# Patient Record
Sex: Male | Born: 1970 | Race: Black or African American | Hispanic: No | Marital: Single | Smoking: Current every day smoker
Health system: Southern US, Community
[De-identification: ages and names within clinical notes are randomized; demographics above are authoritative.]

## PROBLEM LIST (undated history)

## (undated) DIAGNOSIS — K5792 Diverticulitis of intestine, part unspecified, without perforation or abscess without bleeding: Secondary | ICD-10-CM

## (undated) HISTORY — PX: COLON RESECTION: SHX5231

## (undated) HISTORY — PX: HERNIA REPAIR: SHX51

## (undated) HISTORY — PX: TONSILLECTOMY: SUR1361

---

## 2017-03-19 ENCOUNTER — Encounter (HOSPITAL_BASED_OUTPATIENT_CLINIC_OR_DEPARTMENT_OTHER): Payer: Self-pay | Admitting: *Deleted

## 2017-03-19 ENCOUNTER — Emergency Department (HOSPITAL_BASED_OUTPATIENT_CLINIC_OR_DEPARTMENT_OTHER)
Admission: EM | Admit: 2017-03-19 | Discharge: 2017-03-19 | Disposition: A | Discharge status: Home / Self Care | Payer: Self-pay | Attending: Emergency Medicine | Admitting: Emergency Medicine

## 2017-03-19 ENCOUNTER — Emergency Department (HOSPITAL_BASED_OUTPATIENT_CLINIC_OR_DEPARTMENT_OTHER): Payer: Self-pay

## 2017-03-19 DIAGNOSIS — Y999 Unspecified external cause status: Secondary | ICD-10-CM | POA: Insufficient documentation

## 2017-03-19 DIAGNOSIS — W1842XA Slipping, tripping and stumbling without falling due to stepping into hole or opening, initial encounter: Secondary | ICD-10-CM | POA: Insufficient documentation

## 2017-03-19 DIAGNOSIS — Y929 Unspecified place or not applicable: Secondary | ICD-10-CM | POA: Insufficient documentation

## 2017-03-19 DIAGNOSIS — S8391XA Sprain of unspecified site of right knee, initial encounter: Secondary | ICD-10-CM | POA: Insufficient documentation

## 2017-03-19 DIAGNOSIS — Y9301 Activity, walking, marching and hiking: Secondary | ICD-10-CM | POA: Insufficient documentation

## 2017-03-19 DIAGNOSIS — Z87891 Personal history of nicotine dependence: Secondary | ICD-10-CM | POA: Insufficient documentation

## 2017-03-19 HISTORY — DX: Diverticulitis of intestine, part unspecified, without perforation or abscess without bleeding: K57.92

## 2017-03-19 NOTE — ED Provider Notes (Signed)
[facility] Provider [facility]   CSN: [id number] Arrival date & time: 03/19/17  1323     History   Chief Complaint Chief Complaint  Patient presents with  . Knee Injury    HPI Laurence FerrariSimeon Belflower is a 46 y.o. male.  The history is provided by the patient.  Knee Pain   This is a new problem. Episode onset: 3 days ago. The problem occurs constantly. The problem has not changed since onset.The pain is present in the right knee. The quality of the pain is described as aching. The pain is at a severity of 5/10. The pain is moderate. Pertinent negatives include full range of motion. Associated symptoms comments: When attempts to walk when he bends it a certain way develops a severe pain and knee feels like it will give out. The symptoms are aggravated by activity and standing. He has tried OTC pain medications for the symptoms. The treatment provided no relief. There has been a history of trauma (picking up sticks in his yard and stepped in a hole twisting his leg).    Past Medical History:  Diagnosis Date  . Diverticulitis     There are no active problems to display for this patient.   Past Surgical History:  Procedure Laterality Date  . COLON RESECTION    . HERNIA REPAIR    . TONSILLECTOMY         Home Medications    Prior to Admission medications   Not on File    Family History History reviewed. No pertinent family history.  Social History Social History  Substance Use Topics  . Smoking status: Current Some Day Smoker  . Smokeless tobacco: Not on file  . Alcohol use No     Allergies   Patient has no known allergies.   Review of Systems Review of Systems  All other systems reviewed and are negative.    Physical Exam Updated Vital Signs BP (!) 151/65   Pulse 89   Temp 98.2 F (36.8 C)   Resp 18   Ht 5\' 11"  (1.803 m)   Wt 81.6 kg (180 lb)   SpO2 100%   BMI 25.10 kg/m   Physical Exam  Constitutional: He is oriented to person, place, and  time. He appears well-developed and well-nourished.  HENT:  Head: Normocephalic.  Cardiovascular: Normal rate.   Pulmonary/Chest: Effort normal.  Musculoskeletal: He exhibits no deformity.       Right knee: He exhibits normal range of motion, no swelling, no effusion, no ecchymosis, no deformity, no LCL laxity, no bony tenderness and no MCL laxity. Tenderness found. Medial joint line and MCL tenderness noted. No lateral joint line tenderness noted.  Neurological: He is alert and oriented to person, place, and time.  Skin: Skin is warm. Capillary refill takes less than 2 seconds.  Psychiatric: He has a normal mood and affect. His behavior is normal.  Nursing note and vitals reviewed.    ED Treatments / Results  Labs (all labs ordered are listed, but only abnormal results are displayed) Labs Reviewed - No data to display  EKG  EKG Interpretation None       Radiology Dg Knee Complete 4 Views Right  Result Date: 03/19/2017 CLINICAL DATA:  Twisting injury 3 days ago. Posteromedial knee pain and swelling. EXAM: RIGHT KNEE - COMPLETE 4+ VIEW COMPARISON:  None. FINDINGS: The mineralization and alignment are normal. There is no evidence of acute fracture or dislocation. The joint spaces are maintained. No significant joint  effusion or focal soft tissue swelling. IMPRESSION: No acute osseous findings. Electronically Signed   By: Carey Bullocks M.D.   On: 03/19/2017 13:57    Procedures Procedures (including critical care time)  Medications Ordered in ED Medications - No data to display   Initial Impression / Assessment and Plan / ED Course  I have reviewed the triage vital signs and the nursing notes.  Pertinent labs & imaging results that were available during my care of the patient were reviewed by me and considered in my medical decision making (see chart for details).     Patient with symptoms of knee sprain today. On exam tenderness around the medial and posterior knee  without significant swelling. No ligamental laxity appreciated at this time however concern for potential ligamentous injury versus severe sprain. Patient placed in a knee sleeve. X-rays without acute findings. Patient was instructed to elevate, ice and use NSAIDs.  Final Clinical Impressions(s) / ED Diagnoses   Final diagnoses:  Sprain of right knee, unspecified ligament, initial encounter    New Prescriptions New Prescriptions   No medications on file     Gwyneth Sprout, MD 03/19/17 1504

## 2017-03-19 NOTE — ED Triage Notes (Signed)
Pt c/o right knee injury x 3 days ago

## 2018-05-08 ENCOUNTER — Emergency Department (HOSPITAL_BASED_OUTPATIENT_CLINIC_OR_DEPARTMENT_OTHER)
Admission: EM | Admit: 2018-05-08 | Discharge: 2018-05-08 | Disposition: A | Discharge status: Home / Self Care | Payer: No Typology Code available for payment source | Attending: Emergency Medicine | Admitting: Emergency Medicine

## 2018-05-08 ENCOUNTER — Encounter (HOSPITAL_BASED_OUTPATIENT_CLINIC_OR_DEPARTMENT_OTHER): Payer: Self-pay | Admitting: Emergency Medicine

## 2018-05-08 ENCOUNTER — Other Ambulatory Visit: Payer: Self-pay

## 2018-05-08 ENCOUNTER — Emergency Department (HOSPITAL_BASED_OUTPATIENT_CLINIC_OR_DEPARTMENT_OTHER): Payer: No Typology Code available for payment source

## 2018-05-08 DIAGNOSIS — Y9389 Activity, other specified: Secondary | ICD-10-CM | POA: Diagnosis not present

## 2018-05-08 DIAGNOSIS — Y92410 Unspecified street and highway as the place of occurrence of the external cause: Secondary | ICD-10-CM | POA: Insufficient documentation

## 2018-05-08 DIAGNOSIS — F17210 Nicotine dependence, cigarettes, uncomplicated: Secondary | ICD-10-CM | POA: Insufficient documentation

## 2018-05-08 DIAGNOSIS — S161XXA Strain of muscle, fascia and tendon at neck level, initial encounter: Secondary | ICD-10-CM | POA: Diagnosis not present

## 2018-05-08 DIAGNOSIS — S199XXA Unspecified injury of neck, initial encounter: Secondary | ICD-10-CM | POA: Diagnosis present

## 2018-05-08 DIAGNOSIS — Y999 Unspecified external cause status: Secondary | ICD-10-CM | POA: Diagnosis not present

## 2018-05-08 DIAGNOSIS — S39012A Strain of muscle, fascia and tendon of lower back, initial encounter: Secondary | ICD-10-CM

## 2018-05-08 MED ORDER — METHOCARBAMOL 500 MG PO TABS: 500.0000 mg | ORAL_TABLET | Freq: Three times a day (TID) | ORAL | 0 refills | Status: DC | PRN

## 2018-05-08 MED FILL — METHOCARBAMOL 500 MG TABLET: 500 | 2 days supply | Qty: 8 | Fill #0
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
## 2019-12-22 ENCOUNTER — Emergency Department (HOSPITAL_BASED_OUTPATIENT_CLINIC_OR_DEPARTMENT_OTHER)
Admission: EM | Admit: 2019-12-22 | Discharge: 2019-12-22 | Disposition: A | Discharge status: Home / Self Care | Payer: Self-pay | Attending: Emergency Medicine | Admitting: Emergency Medicine

## 2019-12-22 ENCOUNTER — Encounter (HOSPITAL_BASED_OUTPATIENT_CLINIC_OR_DEPARTMENT_OTHER): Payer: Self-pay | Admitting: Emergency Medicine

## 2019-12-22 ENCOUNTER — Other Ambulatory Visit: Payer: Self-pay

## 2019-12-22 DIAGNOSIS — F17210 Nicotine dependence, cigarettes, uncomplicated: Secondary | ICD-10-CM | POA: Insufficient documentation

## 2019-12-22 DIAGNOSIS — I10 Essential (primary) hypertension: Secondary | ICD-10-CM | POA: Insufficient documentation

## 2019-12-22 DIAGNOSIS — Z7901 Long term (current) use of anticoagulants: Secondary | ICD-10-CM | POA: Insufficient documentation

## 2019-12-22 LAB — BASIC METABOLIC PANEL
Anion gap: 8 (ref 5–15)
BUN: 13 mg/dL (ref 6–20)
CO2: 28 mmol/L (ref 22–32)
Calcium: 9.7 mg/dL (ref 8.9–10.3)
Chloride: 102 mmol/L (ref 98–111)
Creatinine, Ser: 0.98 mg/dL (ref 0.61–1.24)
GFR calc Af Amer: >60 mL/min (ref >60)
GFR calc non Af Amer: >60 mL/min (ref >60)
Glucose, Bld: 114 mg/dL — ABNORMAL HIGH (ref 70–99)
Potassium: 3.7 mmol/L (ref 3.5–5.1)
Sodium: 138 mmol/L (ref 135–145)

## 2019-12-22 MED ORDER — LACTATED RINGERS IV BOLUS
1000.0000 mL | Freq: Once | INTRAVENOUS | Status: AC
  Administered 2019-12-22: 1000 mL via INTRAVENOUS

## 2019-12-22 MED ORDER — METOCLOPRAMIDE HCL 5 MG/ML IJ SOLN
10.0000 mg | Freq: Once | INTRAMUSCULAR | Status: AC
  Administered 2019-12-22: 10 mg via INTRAVENOUS
  Filled 2019-12-22: qty 2

## 2019-12-22 MED ORDER — DIPHENHYDRAMINE HCL 50 MG/ML IJ SOLN
12.5000 mg | Freq: Once | INTRAMUSCULAR | Status: AC
  Administered 2019-12-22: 12.5 mg via INTRAVENOUS
  Filled 2019-12-22: qty 1

## 2019-12-22 MED ORDER — AMLODIPINE BESYLATE 5 MG PO TABS: 5.0000 mg | ORAL_TABLET | Freq: Every day | ORAL | 2 refills | Status: DC

## 2019-12-22 MED ORDER — KETOROLAC TROMETHAMINE 15 MG/ML IJ SOLN
15.0000 mg | Freq: Once | INTRAMUSCULAR | Status: AC
  Administered 2019-12-22: 15 mg via INTRAVENOUS
  Filled 2019-12-22: qty 1

## 2019-12-22 MED FILL — AMLODIPINE BESYLATE 5 MG TA: 5 | 30 days supply | Qty: 30 | Fill #0

## 2019-12-22 NOTE — ED Triage Notes (Signed)
Pt at work this morning and his boss was taking her own bp and when pt stated he has been having a headache she took his.  186/114 and then 184/106 at work. Has never been treated for bp but has had random episodes of high bp.  Sts he does not have a pmd.

## 2019-12-26 NOTE — ED Provider Notes (Signed)
[facility] EMERGENCY DEPARTMENT Provider Note   CSN: [id number] Arrival date & time: 12/22/19  0845     History Chief Complaint  Patient presents with  . Hypertension    Clarence Wilson is a 49 y.o. male.  HPI   49 year old male with hypertension.  Noted to be very hypertensive while at work today.  He states that he has had high blood pressure when checked previously but he is not on medications.  He does have a headache currently.  He has had similar headaches previously.  Is diffuse and achy.  No photo or phonophobia.  No changes in visual acuity.  No acute numbness, tingling or focal loss of strength.  No fevers or neck pain/stiffness.   Past Medical History:  Diagnosis Date  . Diverticulitis     There are no problems to display for this patient.   Past Surgical History:  Procedure Laterality Date  . COLON RESECTION    . HERNIA REPAIR    . TONSILLECTOMY         No family history on file.  Social History   Tobacco Use  . Smoking status: Current Every Day Smoker    Packs/day: 0.50    Types: Cigarettes  . Smokeless tobacco: Never Used  Substance Use Topics  . Alcohol use: No  . Drug use: No    Home Medications Prior to Admission medications   Medication Sig Start Date End Date Taking? Authorizing Provider  amLODipine (NORVASC) 5 MG tablet Take 1 tablet (5 mg total) by mouth daily. 12/22/19   Raeford Razor, MD    Allergies    Patient has no known allergies.  Review of Systems   Review of Systems All systems reviewed and negative, other than as noted in HPI.  Physical Exam Updated Vital Signs BP (!) 151/91 (BP Location: Left Arm)   Pulse 79   Temp 98.3 F (36.8 C) (Oral)   Resp 16   Ht 5\' 11"  (1.803 m)   Wt 81.6 kg   SpO2 100%   BMI 25.10 kg/m   Physical Exam Vitals and nursing note reviewed.  Constitutional:      General: He is not in acute distress.    Appearance: He is well-developed.  HENT:     Head: Normocephalic and  atraumatic.  Eyes:     General:        Right eye: No discharge.        Left eye: No discharge.     Conjunctiva/sclera: Conjunctivae normal.  Cardiovascular:     Rate and Rhythm: Normal rate and regular rhythm.     Heart sounds: Normal heart sounds. No murmur heard.  No friction rub. No gallop.   Pulmonary:     Effort: Pulmonary effort is normal. No respiratory distress.     Breath sounds: Normal breath sounds.  Abdominal:     General: There is no distension.     Palpations: Abdomen is soft.     Tenderness: There is no abdominal tenderness.  Musculoskeletal:        General: No tenderness.     Cervical back: Normal range of motion and neck supple. No rigidity.  Skin:    General: Skin is warm and dry.  Neurological:     Mental Status: He is alert and oriented to person, place, and time.     Cranial Nerves: No cranial nerve deficit.     Sensory: No sensory deficit.     Motor: No weakness.  Coordination: Coordination normal.  Psychiatric:        Behavior: Behavior normal.        Thought Content: Thought content normal.     ED Results / Procedures / Treatments   Labs (all labs ordered are listed, but only abnormal results are displayed) Labs Reviewed  BASIC METABOLIC PANEL - Abnormal; Notable for the following components:      Result Value   Glucose, Bld 114 (*)    All other components within normal limits    EKG None  Radiology No results found.  Procedures Procedures (including critical care time)  Medications Ordered in ED Medications  ketorolac (TORADOL) 15 MG/ML injection 15 mg (15 mg Intravenous Given 12/22/19 1025)  lactated ringers bolus 1,000 mL ( Intravenous Stopped 12/22/19 1115)  metoCLOPramide (REGLAN) injection 10 mg (10 mg Intravenous Given 12/22/19 1027)  diphenhydrAMINE (BENADRYL) injection 12.5 mg (12.5 mg Intravenous Given 12/22/19 1023)    ED Course  I have reviewed the triage vital signs and the nursing notes.  Pertinent labs & imaging  results that were available during my care of the patient were reviewed by me and considered in my medical decision making (see chart for details).    MDM Rules/Calculators/A&P                          49 year old male with hypertension.  History of the same.  Not on medications.  Medically screened.  He does have a headache but he has had similar headaches previously.  Neuro exam is nonfocal.  I am not convinced that this is directly related.  Regardless, he is treated symptomatic with significant improvement.  I doubt emergent process.  We will place him on amlodipine.  Needs to keep a log of his blood pressure and follow-up as an outpatient.  Return precautions discussed. Final Clinical Impression(s) / ED Diagnoses Final diagnoses:  Hypertension, unspecified type    Rx / DC Orders ED Discharge Orders         Ordered    amLODipine (NORVASC) 5 MG tablet  Daily     Discontinue  Reprint     12/22/19 1107           Virgel Manifold, MD 12/26/19 1244

## 2020-08-16 ENCOUNTER — Encounter (HOSPITAL_BASED_OUTPATIENT_CLINIC_OR_DEPARTMENT_OTHER): Payer: Self-pay | Admitting: *Deleted

## 2020-08-16 ENCOUNTER — Other Ambulatory Visit (HOSPITAL_BASED_OUTPATIENT_CLINIC_OR_DEPARTMENT_OTHER): Payer: Self-pay | Admitting: Emergency Medicine

## 2020-08-16 ENCOUNTER — Emergency Department (HOSPITAL_BASED_OUTPATIENT_CLINIC_OR_DEPARTMENT_OTHER)
Admission: EM | Admit: 2020-08-16 | Discharge: 2020-08-16 | Disposition: A | Discharge status: Home / Self Care | Payer: Self-pay | Attending: Emergency Medicine | Admitting: Emergency Medicine

## 2020-08-16 ENCOUNTER — Emergency Department (HOSPITAL_BASED_OUTPATIENT_CLINIC_OR_DEPARTMENT_OTHER): Payer: Self-pay

## 2020-08-16 ENCOUNTER — Other Ambulatory Visit: Payer: Self-pay

## 2020-08-16 DIAGNOSIS — S2232XA Fracture of one rib, left side, initial encounter for closed fracture: Secondary | ICD-10-CM | POA: Insufficient documentation

## 2020-08-16 DIAGNOSIS — S2231XA Fracture of one rib, right side, initial encounter for closed fracture: Secondary | ICD-10-CM

## 2020-08-16 DIAGNOSIS — W108XXA Fall (on) (from) other stairs and steps, initial encounter: Secondary | ICD-10-CM | POA: Insufficient documentation

## 2020-08-16 DIAGNOSIS — F17210 Nicotine dependence, cigarettes, uncomplicated: Secondary | ICD-10-CM | POA: Insufficient documentation

## 2020-08-16 DIAGNOSIS — Y9389 Activity, other specified: Secondary | ICD-10-CM | POA: Insufficient documentation

## 2020-08-16 DIAGNOSIS — R059 Cough, unspecified: Secondary | ICD-10-CM | POA: Insufficient documentation

## 2020-08-16 DIAGNOSIS — M546 Pain in thoracic spine: Secondary | ICD-10-CM | POA: Insufficient documentation

## 2020-08-16 LAB — URINALYSIS, MICROSCOPIC (REFLEX)
Bacteria, UA: NONE SEEN
Squamous Epithelial / LPF: NONE SEEN (ref 0–5)

## 2020-08-16 LAB — URINALYSIS, ROUTINE W REFLEX MICROSCOPIC
Bilirubin Urine: NEGATIVE
Glucose, UA: NEGATIVE mg/dL
Ketones, ur: NEGATIVE mg/dL
Leukocytes,Ua: NEGATIVE
Nitrite: NEGATIVE
Specific Gravity, Urine: 1.015 (ref 1.005–1.030)
pH: 7 (ref 5.0–8.0)

## 2020-08-16 MED ORDER — NAPROXEN 500 MG PO TABS: 500.0000 mg | ORAL_TABLET | Freq: Two times a day (BID) | ORAL | 0 refills | Status: DC

## 2020-08-16 MED ORDER — KETOROLAC TROMETHAMINE 30 MG/ML IJ SOLN
30.0000 mg | Freq: Once | INTRAMUSCULAR | Status: AC
  Administered 2020-08-16: 30 mg via INTRAMUSCULAR
  Filled 2020-08-16: qty 1

## 2020-08-16 MED ORDER — OXYCODONE-ACETAMINOPHEN 5-325 MG PO TABS: 1.0000 | ORAL_TABLET | Freq: Four times a day (QID) | ORAL | 0 refills | Status: DC | PRN

## 2020-08-16 MED ORDER — HYDROCODONE-ACETAMINOPHEN 5-325 MG PO TABS
1.0000 | ORAL_TABLET | Freq: Once | ORAL | Status: AC
  Administered 2020-08-16: 1 via ORAL
  Filled 2020-08-16: qty 1

## 2020-08-16 MED FILL — OXYCODONE-ACETAMINOPHEN 5-3: 5-325 | 4 days supply | Qty: 15 | Fill #0

## 2020-08-16 MED FILL — NAPROXEN 500 MG TABS: 500 | 15 days supply | Qty: 30 | Fill #0

## 2020-08-16 NOTE — ED Provider Notes (Signed)
[facility] EMERGENCY DEPARTMENT Provider Note   CSN: [id number] Arrival date & time: 08/16/20  0545     History Chief Complaint  Patient presents with  . Back Pain    Clarence Wilson is a 50 y.o. male.  HPI     This is a 50 year old male who presents with right posterior chest pain.  Patient reports that he slipped and fell taking out the trash 2 days ago.  He fell onto his back.  Since that time he has had progressively worsening right posterior chest pain.  He states that yesterday it was so excruciating he had difficulty getting up and about.  He did not have any medication to take for the pain.  Pain is worse with deep breathing and certain movements.  He denies any weakness, numbness, tingling of the lower extremities.  He does report that he has only urinated once because he has had difficulty getting up and going to the bathroom.  Denies hitting his head or loss of consciousness.  Rates his pain at "12 out of 10."  Past Medical History:  Diagnosis Date  . Diverticulitis     There are no problems to display for this patient.   Past Surgical History:  Procedure Laterality Date  . COLON RESECTION    . HERNIA REPAIR    . TONSILLECTOMY         No family history on file.  Social History   Tobacco Use  . Smoking status: Current Every Day Smoker    Packs/day: 0.50    Types: Cigarettes  . Smokeless tobacco: Never Used  Substance Use Topics  . Alcohol use: Yes    Comment: occasional   . Drug use: No    Home Medications Prior to Admission medications   Not on File    Allergies    Patient has no known allergies.  Review of Systems   Review of Systems  Constitutional: Negative for fever.  Respiratory: Negative for shortness of breath.   Cardiovascular: Positive for chest pain.  Gastrointestinal: Negative for abdominal pain, nausea and vomiting.  Genitourinary: Negative for dysuria.  Skin: Negative for wound.  All other systems reviewed and are  negative.   Physical Exam Updated Vital Signs BP (!) 175/93 (BP Location: Right Arm)   Pulse 84   Temp 98.1 F (36.7 C) (Oral)   Resp 18   Ht 1.803 m (5\' 11" )   Wt 77.1 kg   SpO2 100%   BMI 23.71 kg/m   Physical Exam Vitals and nursing note reviewed.  Constitutional:      Appearance: He is well-developed and well-nourished. He is not ill-appearing.     Comments: ABCs intact  HENT:     Head: Normocephalic and atraumatic.     Nose: Nose normal.     Mouth/Throat:     Mouth: Mucous membranes are moist.  Eyes:     Pupils: Pupils are equal, round, and reactive to light.  Cardiovascular:     Rate and Rhythm: Normal rate and regular rhythm.     Heart sounds: Normal heart sounds. No murmur heard.     Comments: Tenderness palpation right sided posterior chest wall, no crepitus or overlying skin changes Pulmonary:     Effort: Pulmonary effort is normal. No respiratory distress.     Breath sounds: Normal breath sounds. No wheezing.  Abdominal:     General: Bowel sounds are normal.     Palpations: Abdomen is soft.     Tenderness: There  is no abdominal tenderness. There is no rebound.  Musculoskeletal:        General: No edema.     Cervical back: Neck supple.     Right lower leg: No edema.     Left lower leg: No edema.     Comments: No midline thoracic or lumbar tenderness palpation, step-off, or deformity  Lymphadenopathy:     Cervical: No cervical adenopathy.  Skin:    General: Skin is warm and dry.  Neurological:     Mental Status: He is alert and oriented to person, place, and time.     Comments: Ambulatory independently, 5 out of 5 strength bilateral lower extremities  Psychiatric:        Mood and Affect: Mood and affect and mood normal.     ED Results / Procedures / Treatments   Labs (all labs ordered are listed, but only abnormal results are displayed) Labs Reviewed  URINALYSIS, ROUTINE W REFLEX MICROSCOPIC    EKG None  Radiology No results  found.  Procedures Procedures   Medications Ordered in ED Medications  HYDROcodone-acetaminophen (NORCO/VICODIN) 5-325 MG per tablet 1 tablet (has no administration in time range)  ketorolac (TORADOL) 30 MG/ML injection 30 mg (30 mg Intramuscular Given 08/16/20 3570)    ED Course  I have reviewed the triage vital signs and the nursing notes.  Pertinent labs & imaging results that were available during my care of the patient were reviewed by me and considered in my medical decision making (see chart for details).    MDM Rules/Calculators/A&P                          Patient presents with right posterior chest wall pain following.  He is nontoxic and vital signs are notable for blood pressure 175/93.  This could be pain related.  He has point tenderness to palpation over the right posterior chest wall without overlying skin changes.  Suspect fracture versus contusion.  He has no midline tenderness to suggest spinal injury.  He does report difficulty urinating.  Will obtain urinalysis and postvoid residual to make sure that he is not having urinary retention.    Chest x-ray independently reviewed by myself.  He appears to have 1 slightly displaced posterior rib fracture on the right.  Incentive spirometry ordered.  Patient was given Toradol and Norco.  Pulmonary toilet and pain control.  Final Clinical Impression(s) / ED Diagnoses Final diagnoses:  Closed fracture of one rib of right side, initial encounter    Rx / DC Orders ED Discharge Orders    None       Horton, Mayer Masker, MD 08/16/20 [phone]

## 2020-08-16 NOTE — ED Triage Notes (Addendum)
Pt states he was taking the trash out Saturday when he missed a step causing him to fall down 4 stairs. States stairs were carpet. States on Sunday he was having so much pain he was not able to move around much. States pain with inspiration and movement. C/o right sided mid back pain. Denies any blood in urine. Does states that he has only voided once since he fell. States he feels the urge to urinate but he is not able due to the pain.  States he has had a cough since the fall. Denies any tingling or numbness in his legs.

## 2020-08-16 NOTE — ED Notes (Signed)
Pt reported unable to pee at this time; this RN and EMT bladder scanned, got 15mL; EDP informed; verbal orders for In and Out Cath

## 2020-08-16 NOTE — ED Notes (Signed)
Pt able to urinate, In and Out Cath neccessary

## 2020-08-16 NOTE — Discharge Instructions (Addendum)
You were seen today and found to have a rib fracture.  Make sure to use your incentive spirometer.  Use pain medications as prescribed do not drive while using pain medications.

## 2020-08-16 NOTE — ED Notes (Signed)
Pt to Xray.

## 2020-08-16 NOTE — ED Notes (Signed)
EDP at bedside  

## 2020-09-02 ENCOUNTER — Encounter (HOSPITAL_BASED_OUTPATIENT_CLINIC_OR_DEPARTMENT_OTHER): Payer: Self-pay | Admitting: Emergency Medicine

## 2020-09-02 ENCOUNTER — Emergency Department (HOSPITAL_BASED_OUTPATIENT_CLINIC_OR_DEPARTMENT_OTHER): Payer: Self-pay

## 2020-09-02 ENCOUNTER — Emergency Department (HOSPITAL_BASED_OUTPATIENT_CLINIC_OR_DEPARTMENT_OTHER)
Admission: EM | Admit: 2020-09-02 | Discharge: 2020-09-02 | Disposition: A | Discharge status: Home / Self Care | Payer: Self-pay | Attending: Emergency Medicine | Admitting: Emergency Medicine

## 2020-09-02 ENCOUNTER — Other Ambulatory Visit: Payer: Self-pay

## 2020-09-02 DIAGNOSIS — M549 Dorsalgia, unspecified: Secondary | ICD-10-CM | POA: Insufficient documentation

## 2020-09-02 DIAGNOSIS — F17210 Nicotine dependence, cigarettes, uncomplicated: Secondary | ICD-10-CM | POA: Insufficient documentation

## 2020-09-02 DIAGNOSIS — R0781 Pleurodynia: Secondary | ICD-10-CM | POA: Insufficient documentation

## 2020-09-02 MED ORDER — ACETAMINOPHEN 500 MG PO TABS
1000.0000 mg | ORAL_TABLET | Freq: Once | ORAL | Status: AC
  Administered 2020-09-02: 1000 mg via ORAL
  Filled 2020-09-02: qty 2

## 2020-09-02 MED ORDER — IBUPROFEN 200 MG PO TABS
600.0000 mg | ORAL_TABLET | Freq: Once | ORAL | Status: AC
  Administered 2020-09-02: 600 mg via ORAL
  Filled 2020-09-02: qty 1

## 2020-09-02 NOTE — Discharge Instructions (Addendum)
Continue taking motrin 800 mg every 8 hours, and tylenol 650 mg every 6 hours for the next week for pain.  You can take norco as needed.

## 2020-09-02 NOTE — ED Notes (Signed)
Dr Trifan at bedside  

## 2020-09-02 NOTE — ED Notes (Signed)
Pt returns to ED with worsening rib pain today. States he has had ongoing sob since the injury on 2/7 as well. He reports that he did some moving around today and it feels different. Pt alert & oriented; slow and even respirations; nad noted.

## 2020-09-02 NOTE — ED Provider Notes (Signed)
[facility] EMERGENCY DEPARTMENT Provider Note   CSN: [id number] Arrival date & time: 09/02/20  1154     History Chief Complaint  Patient presents with  . thoracic pain    right    Clarence Wilson is a 50 y.o. male with a history of right 10th rib fracture diagnosed 2 weeks ago in the emergency department presenting to emergency department today with worsening chest pain.  He reports his pain has been improving since the last visit until this morning.  He woke up from bed with significant worsening of his right-sided chest pain.  It is a sharp pain that wraps around to his back.  It is worse with inspiration and any movement.  He reports he has been chronically coughing for 2 weeks.  He has been using incentive spirometer as directed whenever he returns home, but does not use it at work in the daytime.  He denies fevers or chills.  He denies any new falls or injuries.  He has not taken any pain medications today because of work.  Ed visit on 08/16/20:  FINDINGS: Normal lung volumes and mediastinal contours. Visualized tracheal air column is within normal limits. Lung markings appear within normal limits, both lungs appear clear with no pneumothorax or pleural effusion.  Negative visible bowel gas pattern.  Bone mineralization is within normal limits. Oblique views of the right ribs. Minimally displaced lateral right 10th rib fracture. No other right rib fracture identified. Other visible osseous structures appear intact.  IMPRESSION: 1. Minimally displaced right 10th rib fracture. 2. No acute cardiopulmonary abnormality.   HPI     Past Medical History:  Diagnosis Date  . Diverticulitis     There are no problems to display for this patient.   Past Surgical History:  Procedure Laterality Date  . COLON RESECTION    . HERNIA REPAIR    . TONSILLECTOMY         No family history on file.  Social History   Tobacco Use  . Smoking status: Current Every  Day Smoker    Packs/day: 0.50    Types: Cigarettes  . Smokeless tobacco: Never Used  Substance Use Topics  . Alcohol use: Yes    Comment: occasional   . Drug use: No    Home Medications Prior to Admission medications   Medication Sig Start Date End Date Taking? Authorizing Provider  naproxen (NAPROSYN) 500 MG tablet Take 1 tablet (500 mg total) by mouth 2 (two) times daily. 08/16/20   Horton, Mayer Masker, MD  oxyCODONE-acetaminophen (PERCOCET/ROXICET) 5-325 MG tablet Take 1 tablet by mouth every 6 (six) hours as needed for severe pain. 08/16/20   Horton, Mayer Masker, MD    Allergies    Patient has no known allergies.  Review of Systems   Review of Systems  Constitutional: Negative for chills and fever.  Respiratory: Negative for cough and shortness of breath.   Cardiovascular: Positive for chest pain. Negative for palpitations.  Gastrointestinal: Negative for abdominal pain and vomiting.  Musculoskeletal: Positive for arthralgias and myalgias.  Skin: Negative for color change and rash.  Neurological: Negative for syncope and light-headedness.  All other systems reviewed and are negative.   Physical Exam Updated Vital Signs BP (!) 158/102   Pulse 80   Temp 97.8 F (36.6 C) (Oral)   Resp 17   SpO2 100%   Physical Exam Constitutional:      General: He is not in acute distress. HENT:     Head: Normocephalic  and atraumatic.  Eyes:     Conjunctiva/sclera: Conjunctivae normal.     Pupils: Pupils are equal, round, and reactive to light.  Cardiovascular:     Rate and Rhythm: Normal rate and regular rhythm.     Pulses: Normal pulses.  Pulmonary:     Effort: Pulmonary effort is normal. No respiratory distress.     Comments: 97% on room air Abdominal:     General: There is no distension.     Tenderness: There is no abdominal tenderness.  Musculoskeletal:     Comments: Right lower/lateral rib tenderness, ribs 9-11th  Skin:    General: Skin is warm and dry.  Neurological:      General: No focal deficit present.     Mental Status: He is alert. Mental status is at baseline.  Psychiatric:        Mood and Affect: Mood normal.        Behavior: Behavior normal.     ED Results / Procedures / Treatments   Labs (all labs ordered are listed, but only abnormal results are displayed) Labs Reviewed - No data to display  EKG None  Radiology CT Chest Wo Contrast  Result Date: 09/02/2020 CLINICAL DATA:  Cough, rib pain.  Recent rib fracture EXAM: CT CHEST WITHOUT CONTRAST TECHNIQUE: Multidetector CT imaging of the chest was performed following the standard protocol without IV contrast. COMPARISON:  08/16/2020 FINDINGS: Cardiovascular: Heart size is normal. No pericardial effusion. Thoracic aorta is normal in course and caliber. Minimal atherosclerotic calcification of the thoracic aorta. Main pulmonary trunk is normal in caliber. Mediastinum/Nodes: No enlarged mediastinal or axillary lymph nodes. Thyroid gland, trachea, and esophagus demonstrate no significant findings. Lungs/Pleura: No pulmonary contusion or laceration. Focal area of pleuroparenchymal scarring at the peripheral aspect of the left lower lobe. No focal airspace consolidation. No pleural effusion or pneumothorax. Upper Abdomen: No acute findings.  Scattered colonic diverticulosis. Musculoskeletal: Subacute minimally displaced fractures of the posterolateral aspects of the right ninth and tenth ribs (series 5, images 97 and 100) with early callus formation. Remaining visualized osseous structures are intact. Thoracic vertebral body heights and alignment are maintained. IMPRESSION: 1. Healing subacute minimally displaced fractures of the posterolateral aspects of the right ninth and tenth ribs. 2. Lungs are clear.  No pneumothorax. 3. Colonic diverticulosis. 4. Aortic atherosclerosis (ICD10-I70.0). Electronically Signed   By: Duanne Guess D.O.   On: 09/02/2020 14:19    Procedures Procedures   Medications  Ordered in ED Medications  acetaminophen (TYLENOL) tablet 1,000 mg (1,000 mg Oral Given 09/02/20 1237)  ibuprofen (ADVIL) tablet 600 mg (600 mg Oral Given 09/02/20 1237)    ED Course  I have reviewed the triage vital signs and the nursing notes.  Pertinent labs & imaging results that were available during my care of the patient were reviewed by me and considered in my medical decision making (see chart for details).  50 year old male here with worsening right lower lung and rib pain beginning this morning.  ddx includes new rib fx vs interval development of PNA vs PTX vs other  No hypoxia, appears comfortable on exam PO motrin and tylenol ordered for pain CT chest noncontrast ordered  Doubt PE, ACS Prior Ed visit records reviewed - xray on 08/16/20  Clinical Course as of 09/02/20 1542  Thu Sep 02, 2020  1326 PAC/[facility] imaging system is down - unable to send images for radiology review at the moment.  I reviewed the images myself and see no evidence of PNA.  He does not want to wait longer as he has to return to work.  He reports his pain did improve with the PO meds.  I think it's reasonable to discharge him at this point - I will contact him if there is concern for new acute findings on CT scan. [MT]  1541 CT report - no acute pulmonary findings.  2 rib fx noted, both with callus formation, likely from original injury.  I called back patient to inform him of these findings - advised same plan of care.  He verbalized understanding. [MT]    Clinical Course User Index [MT] Dionicio Shelnutt, Kermit Balo, MD    Final Clinical Impression(s) / ED Diagnoses Final diagnoses:  Rib pain    Rx / DC Orders ED Discharge Orders    None       Terald Sleeper, MD 09/02/20 [phone]

## 2020-09-02 NOTE — ED Triage Notes (Signed)
Recurrent right ribs cage pain , post fall 2 weeks ago , fractured ribs per pt. Worsening in Shortness of breath .

## 2021-10-24 ENCOUNTER — Emergency Department (HOSPITAL_BASED_OUTPATIENT_CLINIC_OR_DEPARTMENT_OTHER)
Admission: EM | Admit: 2021-10-24 | Discharge: 2021-10-24 | Disposition: A | Discharge status: Home / Self Care | Payer: Self-pay | Attending: Emergency Medicine | Admitting: Emergency Medicine

## 2021-10-24 ENCOUNTER — Emergency Department (HOSPITAL_BASED_OUTPATIENT_CLINIC_OR_DEPARTMENT_OTHER): Payer: Self-pay

## 2021-10-24 ENCOUNTER — Other Ambulatory Visit: Payer: Self-pay

## 2021-10-24 ENCOUNTER — Encounter (HOSPITAL_BASED_OUTPATIENT_CLINIC_OR_DEPARTMENT_OTHER): Payer: Self-pay

## 2021-10-24 ENCOUNTER — Other Ambulatory Visit (HOSPITAL_BASED_OUTPATIENT_CLINIC_OR_DEPARTMENT_OTHER): Payer: Self-pay

## 2021-10-24 DIAGNOSIS — E876 Hypokalemia: Secondary | ICD-10-CM

## 2021-10-24 DIAGNOSIS — R1032 Left lower quadrant pain: Secondary | ICD-10-CM | POA: Insufficient documentation

## 2021-10-24 DIAGNOSIS — R197 Diarrhea, unspecified: Secondary | ICD-10-CM

## 2021-10-24 LAB — COMPREHENSIVE METABOLIC PANEL
ALT: 29 U/L (ref 0–44)
AST: 36 U/L (ref 15–41)
Albumin: 4.5 g/dL (ref 3.5–5.0)
Alkaline Phosphatase: 38 U/L (ref 38–126)
Anion gap: 13 (ref 5–15)
BUN: 9 mg/dL (ref 6–20)
CO2: 26 mmol/L (ref 22–32)
Calcium: 9.4 mg/dL (ref 8.9–10.3)
Chloride: 98 mmol/L (ref 98–111)
Creatinine, Ser: 0.85 mg/dL (ref 0.61–1.24)
GFR, Estimated: >60 mL/min (ref >60)
Glucose, Bld: 106 mg/dL — ABNORMAL HIGH (ref 70–99)
Potassium: 3.2 mmol/L — ABNORMAL LOW (ref 3.5–5.1)
Sodium: 137 mmol/L (ref 135–145)
Total Bilirubin: 1.3 mg/dL — ABNORMAL HIGH (ref 0.3–1.2)
Total Protein: 7.7 g/dL (ref 6.5–8.1)

## 2021-10-24 LAB — CBC WITH DIFFERENTIAL/PLATELET
Abs Immature Granulocytes: 0.02 10*3/uL (ref 0.00–0.07)
Basophils Absolute: 0 10*3/uL (ref 0.0–0.1)
Basophils Relative: 1 %
Eosinophils Absolute: 0 10*3/uL (ref 0.0–0.5)
Eosinophils Relative: 0 %
HCT: 37.7 % — ABNORMAL LOW (ref 39.0–52.0)
Hemoglobin: 12.8 g/dL — ABNORMAL LOW (ref 13.0–17.0)
Immature Granulocytes: 0 %
Lymphocytes Relative: 18 %
Lymphs Abs: 1.5 10*3/uL (ref 0.7–4.0)
MCH: 32 pg (ref 26.0–34.0)
MCHC: 34 g/dL (ref 30.0–36.0)
MCV: 94.3 fL (ref 80.0–100.0)
Monocytes Absolute: 0.7 10*3/uL (ref 0.1–1.0)
Monocytes Relative: 8 %
Neutro Abs: 6 10*3/uL (ref 1.7–7.7)
Neutrophils Relative %: 73 %
Platelets: 213 10*3/uL (ref 150–400)
RBC: 4 MIL/uL — ABNORMAL LOW (ref 4.22–5.81)
RDW: 12.4 % (ref 11.5–15.5)
WBC: 8.3 10*3/uL (ref 4.0–10.5)
nRBC: 0 % (ref 0.0–0.2)

## 2021-10-24 LAB — OCCULT BLOOD X 1 CARD TO LAB, STOOL: Fecal Occult Bld: NEGATIVE

## 2021-10-24 LAB — LIPASE, BLOOD: Lipase: 45 U/L (ref 11–51)

## 2021-10-24 MED ORDER — SODIUM CHLORIDE 0.9 % IV BOLUS
1000.0000 mL | Freq: Once | INTRAVENOUS | Status: AC
  Administered 2021-10-24: 1000 mL via INTRAVENOUS

## 2021-10-24 MED ORDER — DICYCLOMINE HCL 20 MG PO TABS
20.0000 mg | ORAL_TABLET | Freq: Two times a day (BID) | ORAL | 0 refills | Status: AC
  Filled 2021-10-24: qty 10, 5d supply, fill #0

## 2021-10-24 MED ORDER — ONDANSETRON 4 MG PO TBDP
4.0000 mg | ORAL_TABLET | Freq: Three times a day (TID) | ORAL | 0 refills | Status: DC | PRN
  Filled 2021-10-24: qty 4, 2d supply, fill #0

## 2021-10-24 MED ORDER — ONDANSETRON HCL 4 MG/2ML IJ SOLN: 4.0000 mg | Freq: Once | INTRAMUSCULAR | Status: DC

## 2021-10-24 MED ORDER — POTASSIUM CHLORIDE ER 10 MEQ PO TBCR
40.0000 meq | EXTENDED_RELEASE_TABLET | Freq: Every day | ORAL | 0 refills | Status: AC
  Filled 2021-10-24: qty 16, 4d supply, fill #0

## 2021-10-24 MED ORDER — MORPHINE SULFATE (PF) 4 MG/ML IV SOLN: 4.0000 mg | Freq: Once | INTRAVENOUS | Status: DC

## 2021-10-24 MED ORDER — IOHEXOL 300 MG/ML  SOLN
100.0000 mL | Freq: Once | INTRAMUSCULAR | Status: AC | PRN
  Administered 2021-10-24: 100 mL via INTRAVENOUS

## 2021-10-24 NOTE — ED Notes (Signed)
Rectal exam performed by [place], PA. This RN at bedside ?

## 2021-10-24 NOTE — Discharge Instructions (Signed)
Your potassium level was slightly low today.  I have given you potassium pills to take over the next few days. ?Make sure your primary care provider recheck your potassium level in 1 week. ?Take the other medication as needed to help with your symptoms. ?Make sure you are drinking plenty of fluids and slowly advancing your diet as tolerated over the next few days. ?Follow-up with the GI doctor to have imaging such as a colonoscopy or endoscopy. ?Return to the ER if you start to experience worsening symptoms, continued vomiting or pain despite taking medication, bloody stools, chest pain or shortness of breath ?

## 2021-10-24 NOTE — ED Notes (Signed)
Patient transported to CT 

## 2021-10-24 NOTE — ED Triage Notes (Signed)
Pt arrives ambulatory to ED with c.o generalized abdominal pain since last night with 7 episodes of diarrhea in the last 24 hours. Denies NV. History of diverticulitis, states that his stool was dark yesterday.  ?

## 2021-10-24 NOTE — ED Provider Notes (Signed)
?[facility] EMERGENCY DEPARTMENT ?Provider Note ? ? ?CSN: [id number] ?Arrival date & time: 10/24/21  1211 ? ?  ? ?History ? ?Chief Complaint  ?Patient presents with  ? Abdominal Pain  ? ? ?Clarence Wilson is a 51 y.o. male with a past medical history of diverticulitis presenting to the ED with a chief complaint of left lower quadrant abdominal pain, diarrhea.  Symptoms began last night and have persisted today.  He reports 7 episodes of dark-colored diarrhea since symptoms began.  Denies any nausea or vomiting.  He reports history of similar symptoms with diverticulitis in the past.  He did require a surgical procedure for his diverticulitis several years ago but he does not remember exactly what it was.  No sick contacts with similar symptoms.  Denies any suspicious food intake.  No urinary symptoms, chest pain, shortness of breath, fever or chills. Denies BRBPR. ? ? ?Abdominal Pain ?Associated symptoms: diarrhea   ?Associated symptoms: no chest pain, no chills, no constipation, no cough, no dysuria, no fever, no hematuria, no nausea, no shortness of breath, no sore throat and no vomiting   ? ?  ? ?Home Medications ?Prior to Admission medications   ?Medication Sig Start Date End Date Taking? Authorizing Provider  ?dicyclomine (BENTYL) 20 MG tablet Take 1 tablet (20 mg total) by mouth 2 (two) times daily. 10/24/21  Yes Neylan Koroma, PA-C  ?ondansetron (ZOFRAN-ODT) 4 MG disintegrating tablet Take 1 tablet (4 mg total) by mouth every 8 (eight) hours as needed for nausea or vomiting. 10/24/21  Yes Tyan Dy, PA-C  ?potassium chloride (KLOR-CON) 10 MEQ tablet Take 4 tablets (40 mEq total) by mouth daily for 4 days. 10/24/21 10/28/21 Yes Mariska Daffin, PA-C  ?   ? ?Allergies    ?Patient has no known allergies.   ? ?Review of Systems   ?Review of Systems  ?Constitutional:  Negative for appetite change, chills and fever.  ?HENT:  Negative for ear pain, rhinorrhea, sneezing and sore throat.   ?Eyes:  Negative for  photophobia and visual disturbance.  ?Respiratory:  Negative for cough, chest tightness, shortness of breath and wheezing.   ?Cardiovascular:  Negative for chest pain and palpitations.  ?Gastrointestinal:  Positive for abdominal pain, blood in stool and diarrhea. Negative for constipation, nausea and vomiting.  ?Genitourinary:  Negative for dysuria, hematuria and urgency.  ?Musculoskeletal:  Negative for myalgias.  ?Skin:  Negative for rash.  ?Neurological:  Negative for dizziness, weakness and light-headedness.  ? ?Physical Exam ?Updated Vital Signs ?BP (!) 176/110 (BP Location: Left Arm)   Pulse 82   Temp 98.6 ?F (37 ?C) (Oral)   Resp 16   Ht 5\' 11"  (1.803 m)   Wt 79.8 kg   SpO2 100%   BMI 24.55 kg/m?  ?Physical Exam ?Vitals and nursing note reviewed. Exam conducted with a chaperone present.  ?Constitutional:   ?   General: He is not in acute distress. ?   Appearance: He is well-developed.  ?HENT:  ?   Head: Normocephalic and atraumatic.  ?   Nose: Nose normal.  ?Eyes:  ?   General: No scleral icterus.    ?   Left eye: No discharge.  ?   Conjunctiva/sclera: Conjunctivae normal.  ?Cardiovascular:  ?   Rate and Rhythm: Normal rate and regular rhythm.  ?   Heart sounds: Normal heart sounds. No murmur heard. ?  No friction rub. No gallop.  ?Pulmonary:  ?   Effort: Pulmonary effort is normal. No respiratory distress.  ?  Breath sounds: Normal breath sounds.  ?Abdominal:  ?   General: Bowel sounds are normal. There is no distension.  ?   Palpations: Abdomen is soft.  ?   Tenderness: There is abdominal tenderness in the left lower quadrant. There is no guarding.  ?Genitourinary: ?   Comments: Chaperone present for rectal exam.  No external abnormalities.  Stool is brown on DRE. ?Musculoskeletal:     ?   General: Normal range of motion.  ?   Cervical back: Normal range of motion and neck supple.  ?Skin: ?   General: Skin is warm and dry.  ?   Findings: No rash.  ?Neurological:  ?   Mental Status: He is alert.  ?    Motor: No abnormal muscle tone.  ?   Coordination: Coordination normal.  ? ? ?ED Results / Procedures / Treatments   ?Labs ?(all labs ordered are listed, but only abnormal results are displayed) ?Labs Reviewed  ?COMPREHENSIVE METABOLIC PANEL - Abnormal; Notable for the following components:  ?    Result Value  ? Potassium 3.2 (*)   ? Glucose, Bld 106 (*)   ? Total Bilirubin 1.3 (*)   ? All other components within normal limits  ?CBC WITH DIFFERENTIAL/PLATELET - Abnormal; Notable for the following components:  ? RBC 4.00 (*)   ? Hemoglobin 12.8 (*)   ? HCT 37.7 (*)   ? All other components within normal limits  ?LIPASE, BLOOD  ?OCCULT BLOOD X 1 CARD TO LAB, STOOL  ? ? ?EKG ?None ? ?Radiology ?CT ABDOMEN PELVIS W CONTRAST ? ?Result Date: 10/24/2021 ?CLINICAL DATA:  Pain left lower quadrant of abdomen EXAM: CT ABDOMEN AND PELVIS WITH CONTRAST TECHNIQUE: Multidetector CT imaging of the abdomen and pelvis was performed using the standard protocol following bolus administration of intravenous contrast. RADIATION DOSE REDUCTION: This exam was performed according to the departmental dose-optimization program which includes automated exposure control, adjustment of the mA and/or kV according to patient size and/or use of iterative reconstruction technique. CONTRAST:  140mL OMNIPAQUE IOHEXOL 300 MG/ML  SOLN COMPARISON:  01/30/2016 FINDINGS: Lower chest: Linear densities in the lower lung fields may suggest scarring or subsegmental atelectasis. Hepatobiliary: Liver measures 19.8 cm in length. There is fatty infiltration. There is no dilation of bile ducts. Gallbladder is unremarkable. Pancreas: No focal abnormality is seen. Spleen: Coarse calcification is seen.  Spleen is not enlarged. Adrenals/Urinary Tract: Adrenals are unremarkable. There is no hydronephrosis. There are no renal or ureteral stones. Urinary bladder is unremarkable. Stomach/Bowel: Stomach is unremarkable. Small bowel loops are not dilated. Appendix is not  dilated. Scattered diverticula are seen in colon without signs of focal acute diverticulitis. There is mild diffuse wall thickening in the sigmoid colon. In image 67 of series 2, there is wall thickening in the sigmoid colon measuring proximally 1.9 cm in length. There is no pericolic stranding or fluid collection. Vascular/Lymphatic: There are scattered arterial calcifications. Reproductive: Unremarkable. Other: There is no ascites or pneumoperitoneum. Musculoskeletal: Unremarkable. IMPRESSION: There is no evidence of intestinal obstruction or pneumoperitoneum. There is no hydronephrosis. Appendix is not dilated. Enlarged fatty liver. Diverticulosis of colon without signs of focal acute diverticulitis. There is wall thickening in the sigmoid colon which may be due to incomplete distention or chronic diverticulitis. There is focal wall thickening in the sigmoid colon measuring approximately 1.9 cm in length in the left side of pelvis. This may be due to incomplete distention or suggest focal inflammatory or neoplastic process. Endoscopy when the patient's  clinical condition permits should be considered. Other findings as described in the body of the report. Electronically Signed   By: Elmer Picker M.D.   On: 10/24/2021 14:45   ? ?Procedures ?Procedures  ? ? ?Medications Ordered in ED ?Medications  ?morphine (PF) 4 MG/ML injection 4 mg (4 mg Intravenous Not Given 10/24/21 1244)  ?ondansetron (ZOFRAN) injection 4 mg (4 mg Intravenous Patient Refused/Not Given 10/24/21 1244)  ?sodium chloride 0.9 % bolus 1,000 mL (1,000 mLs Intravenous New Bag/Given 10/24/21 1247)  ?iohexol (OMNIPAQUE) 300 MG/ML solution 100 mL (100 mLs Intravenous Contrast Given 10/24/21 1415)  ? ? ?ED Course/ Medical Decision Making/ A&P ?Clinical Course as of 10/24/21 1524  ?Mon Oct 24, 2021  ?1233 Fecal Occult Blood, POC: NEGATIVE [HK]  ?1258 Hemoglobin(!): 12.8 [HK]  ?1324 Potassium(!): 3.2 [HK]  ?1324 Creatinine: 0.85 [HK]  ?  ?Clinical Course  User Index ?[HK] Shelly Coss, Jocilyn Trego, PA-C  ? ?                        ?Medical Decision Making ?Amount and/or Complexity of Data Reviewed ?Labs: ordered. Decision-making details documented in ED Course. ?Radiology: orde

## 2022-03-08 ENCOUNTER — Other Ambulatory Visit (HOSPITAL_BASED_OUTPATIENT_CLINIC_OR_DEPARTMENT_OTHER): Payer: Self-pay

## 2022-03-08 ENCOUNTER — Emergency Department (HOSPITAL_BASED_OUTPATIENT_CLINIC_OR_DEPARTMENT_OTHER)
Admission: EM | Admit: 2022-03-08 | Discharge: 2022-03-08 | Disposition: A | Discharge status: Home / Self Care | Payer: BC Managed Care – PPO | Attending: Emergency Medicine | Admitting: Emergency Medicine

## 2022-03-08 ENCOUNTER — Emergency Department (HOSPITAL_BASED_OUTPATIENT_CLINIC_OR_DEPARTMENT_OTHER): Payer: BC Managed Care – PPO

## 2022-03-08 ENCOUNTER — Other Ambulatory Visit: Payer: Self-pay

## 2022-03-08 ENCOUNTER — Encounter (HOSPITAL_BASED_OUTPATIENT_CLINIC_OR_DEPARTMENT_OTHER): Payer: Self-pay | Admitting: Pediatrics

## 2022-03-08 DIAGNOSIS — R109 Unspecified abdominal pain: Secondary | ICD-10-CM | POA: Diagnosis present

## 2022-03-08 DIAGNOSIS — K529 Noninfective gastroenteritis and colitis, unspecified: Secondary | ICD-10-CM | POA: Diagnosis not present

## 2022-03-08 LAB — COMPREHENSIVE METABOLIC PANEL
ALT: 25 U/L (ref 0–44)
AST: 31 U/L (ref 15–41)
Albumin: 4.2 g/dL (ref 3.5–5.0)
Alkaline Phosphatase: 41 U/L (ref 38–126)
Anion gap: 8 (ref 5–15)
BUN: 11 mg/dL (ref 6–20)
CO2: 24 mmol/L (ref 22–32)
Calcium: 8.8 mg/dL — ABNORMAL LOW (ref 8.9–10.3)
Chloride: 108 mmol/L (ref 98–111)
Creatinine, Ser: 1 mg/dL (ref 0.61–1.24)
GFR, Estimated: >60 mL/min (ref >60)
Glucose, Bld: 108 mg/dL — ABNORMAL HIGH (ref 70–99)
Potassium: 3.5 mmol/L (ref 3.5–5.1)
Sodium: 140 mmol/L (ref 135–145)
Total Bilirubin: 1.2 mg/dL (ref 0.3–1.2)
Total Protein: 7.3 g/dL (ref 6.5–8.1)

## 2022-03-08 LAB — CBC
HCT: 39.7 % (ref 39.0–52.0)
Hemoglobin: 13.1 g/dL (ref 13.0–17.0)
MCH: 31.4 pg (ref 26.0–34.0)
MCHC: 33 g/dL (ref 30.0–36.0)
MCV: 95.2 fL (ref 80.0–100.0)
Platelets: 286 10*3/uL (ref 150–400)
RBC: 4.17 MIL/uL — ABNORMAL LOW (ref 4.22–5.81)
RDW: 12.3 % (ref 11.5–15.5)
WBC: 3.8 10*3/uL — ABNORMAL LOW (ref 4.0–10.5)
nRBC: 0 % (ref 0.0–0.2)

## 2022-03-08 LAB — HEPATITIS PANEL, ACUTE
HCV Ab: NONREACTIVE
Hep A IgM: NONREACTIVE
Hep B C IgM: NONREACTIVE
Hepatitis B Surface Ag: NONREACTIVE

## 2022-03-08 LAB — LIPASE, BLOOD: Lipase: 41 U/L (ref 11–51)

## 2022-03-08 MED ORDER — DICYCLOMINE HCL 10 MG PO CAPS
10.0000 mg | ORAL_CAPSULE | Freq: Once | ORAL | Status: AC
  Administered 2022-03-08: 10 mg via ORAL
  Filled 2022-03-08: qty 1

## 2022-03-08 MED ORDER — SODIUM CHLORIDE 0.9 % IV BOLUS
1000.0000 mL | Freq: Once | INTRAVENOUS | Status: AC
Start: 2022-03-08 — End: 2022-03-08
  Administered 2022-03-08: 1000 mL via INTRAVENOUS

## 2022-03-08 MED ORDER — IOHEXOL 300 MG/ML  SOLN
100.0000 mL | Freq: Once | INTRAMUSCULAR | Status: DC | PRN
Start: 2022-03-08 — End: 2022-03-08

## 2022-03-08 MED ORDER — KETOROLAC TROMETHAMINE 15 MG/ML IJ SOLN
15.0000 mg | Freq: Once | INTRAMUSCULAR | Status: AC
  Administered 2022-03-08: 15 mg via INTRAVENOUS
  Filled 2022-03-08: qty 1

## 2022-03-08 MED ORDER — ONDANSETRON HCL 4 MG PO TABS
4.0000 mg | ORAL_TABLET | Freq: Four times a day (QID) | ORAL | 0 refills | Status: AC
  Filled 2022-03-08: qty 12, 19d supply, fill #0

## 2022-03-08 NOTE — ED Provider Notes (Signed)
[facility] EMERGENCY DEPARTMENT Provider Note   CSN: [id number] Arrival date & time: 03/08/22  6195     History  Chief Complaint  Patient presents with   Abdominal Pain    Clarence Wilson is a 51 y.o. male.  Patient is a 51 yo male with pmh of diverticulitis presenting for complaints of abdominal pain and diarrhea. Pt states he had fish from a food truck Saturday night. States he started having generalized lower abdominal discomfort and several episodes of watery diarrhea Sunday morning. States he had diarrhea Sunday, Monday, and Tuesday every 3-4 hours. No bowel movement today but continued mild lower abdominal discomfort.   The history is provided by the patient. No language interpreter was used.  Abdominal Pain Associated symptoms: diarrhea   Associated symptoms: no chest pain, no chills, no cough, no dysuria, no fever, no hematuria, no shortness of breath, no sore throat and no vomiting        Home Medications Prior to Admission medications   Medication Sig Start Date End Date Taking? Authorizing Provider  ondansetron (ZOFRAN) 4 MG tablet Take 1 tablet (4 mg total) by mouth every 6 (six) hours. 03/08/22  Yes Edwin Dada P, DO  dicyclomine (BENTYL) 20 MG tablet Take 1 tablet (20 mg total) by mouth 2 (two) times daily. 10/24/21   Khatri, Hina, PA-C  ondansetron (ZOFRAN-ODT) 4 MG disintegrating tablet Take 1 tablet (4 mg total) by mouth every 8 (eight) hours as needed for nausea or vomiting. 10/24/21   Khatri, Hina, PA-C  potassium chloride (KLOR-CON) 10 MEQ tablet Take 4 tablets (40 mEq total) by mouth daily for 4 days. 10/24/21 10/28/21  Dietrich Pates, PA-C      Allergies    Patient has no known allergies.    Review of Systems   Review of Systems  Constitutional:  Negative for chills and fever.  HENT:  Negative for ear pain and sore throat.   Eyes:  Negative for pain and visual disturbance.  Respiratory:  Negative for cough and shortness of breath.    Cardiovascular:  Negative for chest pain and palpitations.  Gastrointestinal:  Positive for abdominal pain and diarrhea. Negative for vomiting.  Genitourinary:  Negative for dysuria and hematuria.  Musculoskeletal:  Negative for arthralgias and back pain.  Skin:  Negative for color change and rash.  Neurological:  Negative for seizures and syncope.  All other systems reviewed and are negative.   Physical Exam Updated Vital Signs BP (!) 162/107   Pulse 66   Temp 98.9 F (37.2 C) (Oral)   Resp 17   Ht 5\' 11"  (1.803 m)   Wt 81.2 kg   SpO2 100%   BMI 24.97 kg/m  Physical Exam Vitals and nursing note reviewed.  Constitutional:      General: He is not in acute distress.    Appearance: He is well-developed.  HENT:     Head: Normocephalic and atraumatic.  Eyes:     Conjunctiva/sclera: Conjunctivae normal.  Cardiovascular:     Rate and Rhythm: Normal rate and regular rhythm.     Heart sounds: No murmur heard. Pulmonary:     Effort: Pulmonary effort is normal. No respiratory distress.     Breath sounds: Normal breath sounds.  Abdominal:     Palpations: Abdomen is soft.     Tenderness: There is no abdominal tenderness.  Musculoskeletal:        General: No swelling.     Cervical back: Neck supple.  Skin:  General: Skin is warm and dry.     Capillary Refill: Capillary refill takes less than 2 seconds.  Neurological:     Mental Status: He is alert.  Psychiatric:        Mood and Affect: Mood normal.     ED Results / Procedures / Treatments   Labs (all labs ordered are listed, but only abnormal results are displayed) Labs Reviewed  COMPREHENSIVE METABOLIC PANEL - Abnormal; Notable for the following components:      Result Value   Glucose, Bld 108 (*)    Calcium 8.8 (*)    All other components within normal limits  CBC - Abnormal; Notable for the following components:   WBC 3.8 (*)    RBC 4.17 (*)    All other components within normal limits  LIPASE, BLOOD   HEPATITIS PANEL, ACUTE    EKG None  Radiology No results found.  Procedures Procedures    Medications Ordered in ED Medications  sodium chloride 0.9 % bolus 1,000 mL ( Intravenous Stopped 03/08/22 1002)  ketorolac (TORADOL) 15 MG/ML injection 15 mg (15 mg Intravenous Given 03/08/22 0843)  dicyclomine (BENTYL) capsule 10 mg (10 mg Oral Given 03/08/22 0844)    ED Course/ Medical Decision Making/ A&P                           Medical Decision Making Amount and/or Complexity of Data Reviewed Labs: ordered. Radiology: ordered.  Risk Prescription drug management.   11:53 PM 51 yo male with pmh of diverticulitis presenting for complaints of abdominal pain and diarrhea after fish from a food truck.  Is alert and oriented x3, no acute distress, afebrile, stable vital signs.  Abdomen is soft and nontender on exam.  Patient has stable laboratory studies including stable liver profile, lipase, and function.  IV fluids and Toradol given for symptomatic management.  Patient offered CT abdomen and pelvis to rule out diverticulitis however given suspicious food intake and resolving symptoms we have decided to hold off at this time with shared decision making.  Patient had improvement of symptoms after IV fluids and Toradol.  Safe for discharge home at this time.  We did send off a hepatitis panel for suspicion for hepatitis A prior to discharge.  Patient agrees to follow his MyChart for results.  Patient in no distress and overall condition improved here in the ED. Detailed discussions were had with the patient regarding current findings, and need for close f/u with PCP or on call doctor. The patient has been instructed to return immediately if the symptoms worsen in any way for re-evaluation. Patient verbalized understanding and is in agreement with current care plan. All questions answered prior to discharge.         Final Clinical Impression(s) / ED Diagnoses Final diagnoses:   Gastroenteritis    Rx / DC Orders ED Discharge Orders          Ordered    ondansetron (ZOFRAN) 4 MG tablet  Every 6 hours        03/08/22 0941              Franne Forts, DO 03/10/22 2353

## 2022-03-08 NOTE — ED Notes (Signed)
Lab to add hepatitis panel to sample received earlier.

## 2022-03-08 NOTE — Discharge Instructions (Signed)
Zofran sent to pharmacy to help with diarrhea and nausea.  You were tested for hepatitis A while you were here.  Results will take 1 to 2 days.  Please follow your MyChart for results.  Treatment is still symptomatically to help improve diarrhea however its important to know whether or not the food establishment you are eating from resulted in hepatitis A exposure.  Drink lots of water and stay hydrated.

## 2022-03-08 NOTE — ED Triage Notes (Signed)
C/O  bilateral mid to lower abdominal pain since Saturday along w/ nausea and some diarrhea; reported maybe ate some bad fish then. Stated hasn't been able to have a BM since yesterday. Hx of diverticulitis.

## 2022-03-08 NOTE — ED Notes (Signed)
Patient transported to CT 

## 2022-04-13 ENCOUNTER — Emergency Department (HOSPITAL_BASED_OUTPATIENT_CLINIC_OR_DEPARTMENT_OTHER)
Admission: EM | Admit: 2022-04-13 | Discharge: 2022-04-13 | Disposition: A | Discharge status: Home / Self Care | Payer: No Typology Code available for payment source | Attending: Emergency Medicine | Admitting: Emergency Medicine

## 2022-04-13 ENCOUNTER — Encounter (HOSPITAL_BASED_OUTPATIENT_CLINIC_OR_DEPARTMENT_OTHER): Payer: Self-pay

## 2022-04-13 ENCOUNTER — Other Ambulatory Visit: Payer: Self-pay

## 2022-04-13 DIAGNOSIS — J029 Acute pharyngitis, unspecified: Secondary | ICD-10-CM | POA: Insufficient documentation

## 2022-04-13 DIAGNOSIS — Z20822 Contact with and (suspected) exposure to covid-19: Secondary | ICD-10-CM | POA: Diagnosis not present

## 2022-04-13 LAB — RESP PANEL BY RT-PCR (FLU A&B, COVID) ARPGX2
Influenza A by PCR: NEGATIVE
Influenza B by PCR: NEGATIVE
SARS Coronavirus 2 by RT PCR: NEGATIVE

## 2022-04-13 LAB — GROUP A STREP BY PCR: Group A Strep by PCR: NOT DETECTED

## 2022-04-13 NOTE — ED Provider Notes (Signed)
[facility] EMERGENCY DEPARTMENT Provider Note   CSN: [id number] Arrival date & time: 04/13/22  4098     History  Chief Complaint  Patient presents with   Sore Throat    Clarence Wilson is a 51 y.o. male.  Presenting to the ED with a chief complaint of sore throat for 1 day.  Earlier this week she was feeling very fatigued and had a couple episodes of diarrhea since resolved.  States yesterday started noticing a scratchy throat and this is gotten worse since then.  He is a strep throat has been going around his work so he is concerned about this.  He also request COVID and flu testing.  He denies any cough, ear pain, shortness of breath, fevers, chills, chest pain, abdominal pain, nausea, or vomiting.   Sore Throat       Home Medications Prior to Admission medications   Medication Sig Start Date End Date Taking? Authorizing Provider  dicyclomine (BENTYL) 20 MG tablet Take 1 tablet (20 mg total) by mouth 2 (two) times daily. 10/24/21   Khatri, Hina, PA-C  ondansetron (ZOFRAN) 4 MG tablet Take 1 tablet (4 mg total) by mouth every 6 (six) hours. 03/08/22   Edwin Dada P, DO  ondansetron (ZOFRAN-ODT) 4 MG disintegrating tablet Take 1 tablet (4 mg total) by mouth every 8 (eight) hours as needed for nausea or vomiting. 10/24/21   Khatri, Hina, PA-C  potassium chloride (KLOR-CON) 10 MEQ tablet Take 4 tablets (40 mEq total) by mouth daily for 4 days. 10/24/21 10/28/21  Dietrich Pates, PA-C      Allergies    Patient has no known allergies.    Review of Systems   Review of Systems  Constitutional:  Positive for fatigue.  HENT:  Positive for sore throat.   Gastrointestinal:  Positive for diarrhea.  All other systems reviewed and are negative.   Physical Exam Updated Vital Signs BP (!) 172/98   Pulse 88   Temp 98 F (36.7 C) (Oral)   Resp 16   Ht 5\' 11"  (1.803 m)   Wt 79.4 kg   SpO2 100%   BMI 24.41 kg/m  Physical Exam Vitals and nursing note reviewed.   Constitutional:      General: He is not in acute distress.    Appearance: Normal appearance. He is well-developed. He is not ill-appearing, toxic-appearing or diaphoretic.  HENT:     Head: Normocephalic and atraumatic.     Right Ear: Tympanic membrane and ear canal normal. No drainage, swelling or tenderness. No middle ear effusion. Tympanic membrane is not erythematous.     Left Ear: Tympanic membrane and ear canal normal. No drainage, swelling or tenderness.  No middle ear effusion. Tympanic membrane is not erythematous.     Nose: No nasal deformity, congestion or rhinorrhea.     Mouth/Throat:     Lips: Pink. No lesions.     Mouth: Mucous membranes are moist. No oral lesions.     Pharynx: Oropharynx is clear. Uvula midline. No pharyngeal swelling, oropharyngeal exudate, posterior oropharyngeal erythema or uvula swelling.     Tonsils: No tonsillar exudate or tonsillar abscesses. 1+ on the right. 1+ on the left.  Eyes:     General: Gaze aligned appropriately. No scleral icterus.       Right eye: No discharge.        Left eye: No discharge.     Conjunctiva/sclera: Conjunctivae normal.     Right eye: Right conjunctiva is not injected. No  exudate or hemorrhage.    Left eye: Left conjunctiva is not injected. No exudate or hemorrhage. Cardiovascular:     Rate and Rhythm: Normal rate and regular rhythm.     Heart sounds: Normal heart sounds. No murmur heard.    No friction rub. No gallop.  Pulmonary:     Effort: Pulmonary effort is normal. No respiratory distress.     Breath sounds: Normal breath sounds. No stridor. No wheezing, rhonchi or rales.  Abdominal:     General: There is no distension.     Palpations: Abdomen is soft.     Tenderness: There is no abdominal tenderness. There is no guarding or rebound.  Lymphadenopathy:     Cervical: No cervical adenopathy.  Skin:    General: Skin is warm and dry.  Neurological:     Mental Status: He is alert and oriented to person, place, and  time.  Psychiatric:        Mood and Affect: Mood normal.        Speech: Speech normal.        Behavior: Behavior normal. Behavior is cooperative.     ED Results / Procedures / Treatments   Labs (all labs ordered are listed, but only abnormal results are displayed) Labs Reviewed  GROUP A STREP BY PCR  RESP PANEL BY RT-PCR (FLU A&B, COVID) [id number]    EKG None  Radiology No results found.  Procedures Procedures   Medications Ordered in ED Medications - No data to display  ED Course/ Medical Decision Making/ A&P                           Medical Decision Making  Patient coming in with 1 day of URI symptoms.  He is in no acute distress and is stable vitals.  HEENT exam is unremarkable and very low suspicion for deep space neck infection, or PTA.  He has been tested for strep, COVID, and flu and these were all negative.  I relayed these results to the patient.  I recommend supportive treatments at home for likely viral illness.  Return precautions provided.  Final Clinical Impression(s) / ED Diagnoses Final diagnoses:  Viral pharyngitis    Rx / DC Orders ED Discharge Orders     None         Adolphus Birchwood, PA-C 04/13/22 1055    Audley Hose, MD 04/13/22 1553

## 2022-04-13 NOTE — ED Triage Notes (Signed)
Patient c/o sore throat x yesterday - wants to be checked for strep

## 2022-04-13 NOTE — Discharge Instructions (Signed)
Your COVID, flu, and strep test were all negative. You likely are developing a viral upper respiratory infection.  Read the instructions below on reasons to return to the emergency department and to learn more about your diagnosis.  Use over the counter medications for symptomatic relief as we discussed (musinex as a decongestant, Tylenol for fever/pain, Motrin/Ibuprofen for muscle aches). If prescribed a cough suppressant during your visit, do not operate heavy machinery with in 5 hours of taking this medication. Followup with your primary care doctor in 4 days if your symptoms persist.  Your more than welcome to return to the emergency department if symptoms worsen or become concerning.  Upper Respiratory Infection, Adult  An upper respiratory infection (URI) is also sometimes known as the common cold. Most people improve within 1 week, but symptoms can last up to 2 weeks. A residual cough may last even longer.   URI is most commonly caused by a virus. Viruses are NOT treated with antibiotics. You can easily spread the virus to others by oral contact. This includes kissing, sharing a glass, coughing, or sneezing. Touching your mouth or nose and then touching a surface, which is then touched by another person, can also spread the virus.   TREATMENT  Treatment is directed at relieving symptoms. There is no cure. Antibiotics are not effective, because the infection is caused by a virus, not by bacteria. Treatment may include:  Increased fluid intake. Sports drinks offer valuable electrolytes, sugars, and fluids.  Breathing heated mist or steam (vaporizer or shower).  Eating chicken soup or other clear broths, and maintaining good nutrition.  Getting plenty of rest.  Using gargles or lozenges for comfort.  Controlling fevers with ibuprofen or acetaminophen as directed by your caregiver.  Increasing usage of your inhaler if you have asthma.  Return to work when your temperature has returned to normal.    SEEK MEDICAL CARE IF:  After the first few days, you feel you are getting worse rather than better.  You develop worsening shortness of breath, or brown or red sputum. These may be signs of pneumonia.  You develop yellow or brown nasal discharge or pain in the face, especially when you bend forward. These may be signs of sinusitis.  You develop a fever, swollen neck glands, pain with swallowing, or white areas in the back of your throat. These may be signs of strep throat.

## 2022-09-26 ENCOUNTER — Encounter (HOSPITAL_BASED_OUTPATIENT_CLINIC_OR_DEPARTMENT_OTHER): Payer: Self-pay | Admitting: Emergency Medicine

## 2022-09-26 ENCOUNTER — Other Ambulatory Visit: Payer: Self-pay

## 2022-09-26 ENCOUNTER — Emergency Department (HOSPITAL_BASED_OUTPATIENT_CLINIC_OR_DEPARTMENT_OTHER): Payer: Self-pay

## 2022-09-26 ENCOUNTER — Emergency Department (HOSPITAL_BASED_OUTPATIENT_CLINIC_OR_DEPARTMENT_OTHER)
Admission: EM | Admit: 2022-09-26 | Discharge: 2022-09-26 | Disposition: A | Discharge status: Home / Self Care | Payer: Self-pay | Attending: Emergency Medicine | Admitting: Emergency Medicine

## 2022-09-26 DIAGNOSIS — Z20822 Contact with and (suspected) exposure to covid-19: Secondary | ICD-10-CM | POA: Insufficient documentation

## 2022-09-26 DIAGNOSIS — J069 Acute upper respiratory infection, unspecified: Secondary | ICD-10-CM | POA: Insufficient documentation

## 2022-09-26 LAB — RESP PANEL BY RT-PCR (RSV, FLU A&B, COVID)  RVPGX2
Influenza A by PCR: NEGATIVE
Influenza B by PCR: NEGATIVE
Resp Syncytial Virus by PCR: NEGATIVE
SARS Coronavirus 2 by RT PCR: NEGATIVE

## 2022-09-26 MED ORDER — IBUPROFEN 400 MG PO TABS
600.0000 mg | ORAL_TABLET | Freq: Once | ORAL | Status: AC
  Administered 2022-09-26: 600 mg via ORAL
  Filled 2022-09-26: qty 1

## 2022-09-26 MED ORDER — ONDANSETRON 4 MG PO TBDP: 4.0000 mg | ORAL_TABLET | Freq: Three times a day (TID) | ORAL | 0 refills | Status: AC | PRN

## 2022-09-26 NOTE — ED Provider Notes (Signed)
[facility] Provider Note   CSN: [id number] Arrival date & time: 09/26/22  1629     History  Chief Complaint  Patient presents with   Fever    Clarence Wilson is a 52 y.o. male.  52 year old male presents today for evaluation of not feeling well for the past 2 to 3 days.  He states he was at a [employer] over the weekend with family and friends.  He states since then he has had cough, sore throat, sweats.  Reports 1 episode of emesis.  Not currently nauseous.  Denies abdominal pain, chest pain, or shortness of breath.  Decreased fluid intake but states adequate hydration.  The history is provided by the patient. No language interpreter was used.       Home Medications Prior to Admission medications   Medication Sig Start Date End Date Taking? Authorizing Provider  dicyclomine (BENTYL) 20 MG tablet Take 1 tablet (20 mg total) by mouth 2 (two) times daily. 10/24/21   Khatri, Hina, PA-C  ondansetron (ZOFRAN) 4 MG tablet Take 1 tablet (4 mg total) by mouth every 6 (six) hours. [id number]   Campbell Stall P, DO  ondansetron (ZOFRAN-ODT) 4 MG disintegrating tablet Take 1 tablet (4 mg total) by mouth every 8 (eight) hours as needed for nausea or vomiting. 10/24/21   Khatri, Hina, PA-C  potassium chloride (KLOR-CON) 10 MEQ tablet Take 4 tablets (40 mEq total) by mouth daily for 4 days. 10/24/21 10/28/21  Delia Heady, PA-C      Allergies    Patient has no known allergies.    Review of Systems   Review of Systems  Constitutional:  Positive for fatigue. Negative for chills and fever.  HENT:  Positive for sore throat. Negative for trouble swallowing and voice change.   Respiratory:  Positive for cough. Negative for shortness of breath.   Cardiovascular:  Negative for chest pain.  Gastrointestinal:  Negative for abdominal pain.  Neurological:  Negative for light-headedness.  All other systems reviewed and are negative.   Physical  Exam Updated Vital Signs BP (!) 199/110 (BP Location: Left Arm)   Pulse (!) 102   Temp 98.2 F (36.8 C)   Resp 20   Ht 5\' 11"  (1.803 m)   Wt 75.8 kg   SpO2 100%   BMI 23.29 kg/m  Physical Exam Vitals and nursing note reviewed.  Constitutional:      General: He is not in acute distress.    Appearance: Normal appearance. He is not ill-appearing.  HENT:     Head: Normocephalic and atraumatic.     Nose: Nose normal.     Mouth/Throat:     Mouth: Mucous membranes are moist.     Pharynx: No oropharyngeal exudate or posterior oropharyngeal erythema.  Eyes:     General: No scleral icterus.    Extraocular Movements: Extraocular movements intact.     Conjunctiva/sclera: Conjunctivae normal.  Cardiovascular:     Rate and Rhythm: Normal rate and regular rhythm.     Pulses: Normal pulses.  Pulmonary:     Effort: Pulmonary effort is normal. No respiratory distress.     Breath sounds: Normal breath sounds. No wheezing or rales.  Abdominal:     General: There is no distension.     Palpations: Abdomen is soft.     Tenderness: There is no abdominal tenderness. There is no guarding.  Musculoskeletal:        General: Normal range of motion.  Cervical back: Normal range of motion.  Skin:    General: Skin is warm and dry.  Neurological:     General: No focal deficit present.     Mental Status: He is alert. Mental status is at baseline.     ED Results / Procedures / Treatments   Labs (all labs ordered are listed, but only abnormal results are displayed) Labs Reviewed  RESP PANEL BY RT-PCR (RSV, FLU A&B, COVID)  RVPGX2    EKG None  Radiology No results found.  Procedures Procedures    Medications Ordered in ED Medications  ibuprofen (ADVIL) tablet 600 mg (has no administration in time range)    ED Course/ Medical Decision Making/ A&P                             Medical Decision Making Amount and/or Complexity of Data Reviewed Radiology: ordered.   52 year old  male presents today for evaluation of not feeling well since the weekend.  He was at a [employer].  Does appear ill but not in acute distress.  Does have URI symptoms.  4 Plex respiratory panel negative.  Will obtain chest x-ray to rule out pneumonia.  Likely viral URI though.  Will provide dose of Motrin.  He is able to hydrate well by drinking p.o. fluids.  Chest x-ray negative for acute cardiopulmonary process.  Symptomatic management discussed.  He is appropriate for discharge.  Discharged in stable condition.  Likely viral URI.  Final Clinical Impression(s) / ED Diagnoses Final diagnoses:  Viral URI with cough    Rx / DC Orders ED Discharge Orders     None         Evlyn Courier, PA-C 09/26/22 1818    Gareth Morgan, MD 09/27/22 1423

## 2022-09-26 NOTE — Discharge Instructions (Signed)
Your exam today overall reassuring.  Likely of a viral upper respiratory infection.  Chest x-ray did not show any concerns.  Drink plenty of fluids.  Take Tylenol and ibuprofen as you need to for fever and bodyaches.  For any concerning symptoms return to the emergency department.

## 2022-09-26 NOTE — ED Notes (Signed)
Pt discharged to home. Discharge instructions have been discussed with patient and/or family members. Pt verbally acknowledges understanding d/c instructions, and endorses comprehension to checkout at registration before leaving.  °

## 2022-09-26 NOTE — ED Triage Notes (Signed)
Hot and cold chill and has had some nausea and vomitng since this week has had cold s/s

## 2022-12-07 ENCOUNTER — Other Ambulatory Visit (HOSPITAL_BASED_OUTPATIENT_CLINIC_OR_DEPARTMENT_OTHER): Payer: Self-pay

## 2022-12-07 ENCOUNTER — Encounter (HOSPITAL_BASED_OUTPATIENT_CLINIC_OR_DEPARTMENT_OTHER): Payer: Self-pay | Admitting: Emergency Medicine

## 2022-12-07 ENCOUNTER — Encounter (HOSPITAL_BASED_OUTPATIENT_CLINIC_OR_DEPARTMENT_OTHER): Payer: Self-pay

## 2022-12-07 ENCOUNTER — Emergency Department (HOSPITAL_BASED_OUTPATIENT_CLINIC_OR_DEPARTMENT_OTHER)
Admission: EM | Admit: 2022-12-07 | Discharge: 2022-12-07 | Disposition: A | Discharge status: Home / Self Care | Payer: Self-pay | Attending: Emergency Medicine | Admitting: Emergency Medicine

## 2022-12-07 ENCOUNTER — Other Ambulatory Visit: Payer: Self-pay

## 2022-12-07 ENCOUNTER — Emergency Department (HOSPITAL_BASED_OUTPATIENT_CLINIC_OR_DEPARTMENT_OTHER): Payer: Self-pay

## 2022-12-07 DIAGNOSIS — K140 Glossitis: Secondary | ICD-10-CM | POA: Insufficient documentation

## 2022-12-07 DIAGNOSIS — J069 Acute upper respiratory infection, unspecified: Secondary | ICD-10-CM | POA: Insufficient documentation

## 2022-12-07 DIAGNOSIS — R220 Localized swelling, mass and lump, head: Secondary | ICD-10-CM

## 2022-12-07 DIAGNOSIS — Z79899 Other long term (current) drug therapy: Secondary | ICD-10-CM | POA: Insufficient documentation

## 2022-12-07 DIAGNOSIS — I10 Essential (primary) hypertension: Secondary | ICD-10-CM | POA: Insufficient documentation

## 2022-12-07 DIAGNOSIS — T50905A Adverse effect of unspecified drugs, medicaments and biological substances, initial encounter: Secondary | ICD-10-CM | POA: Insufficient documentation

## 2022-12-07 LAB — BASIC METABOLIC PANEL
Anion gap: 12 (ref 5–15)
BUN: 9 mg/dL (ref 6–20)
CO2: 24 mmol/L (ref 22–32)
Calcium: 9.4 mg/dL (ref 8.9–10.3)
Chloride: 99 mmol/L (ref 98–111)
Creatinine, Ser: 0.86 mg/dL (ref 0.61–1.24)
GFR, Estimated: >60 mL/min (ref >60)
Glucose, Bld: 145 mg/dL — ABNORMAL HIGH (ref 70–99)
Potassium: 3.4 mmol/L — ABNORMAL LOW (ref 3.5–5.1)
Sodium: 135 mmol/L (ref 135–145)

## 2022-12-07 LAB — CBC
HCT: 42.4 % (ref 39.0–52.0)
Hemoglobin: 14.1 g/dL (ref 13.0–17.0)
MCH: 32.2 pg (ref 26.0–34.0)
MCHC: 33.3 g/dL (ref 30.0–36.0)
MCV: 96.8 fL (ref 80.0–100.0)
Platelets: 255 10*3/uL (ref 150–400)
RBC: 4.38 MIL/uL (ref 4.22–5.81)
RDW: 12.7 % (ref 11.5–15.5)
WBC: 5.3 10*3/uL (ref 4.0–10.5)
nRBC: 0 % (ref 0.0–0.2)

## 2022-12-07 LAB — GROUP A STREP BY PCR: Group A Strep by PCR: NOT DETECTED

## 2022-12-07 MED ORDER — AMLODIPINE BESYLATE 5 MG PO TABS
5.0000 mg | ORAL_TABLET | Freq: Every day | ORAL | 2 refills | Status: DC
  Filled 2022-12-07: qty 30, 30d supply, fill #0

## 2022-12-07 MED ORDER — METHYLPREDNISOLONE SODIUM SUCC 125 MG IJ SOLR
125.0000 mg | INTRAMUSCULAR | Status: AC
  Administered 2022-12-07: 125 mg via INTRAVENOUS
  Filled 2022-12-07: qty 2

## 2022-12-07 MED ORDER — GUAIFENESIN ER 1200 MG PO TB12
1.0000 | ORAL_TABLET | Freq: Two times a day (BID) | ORAL | 0 refills | Status: AC
  Filled 2022-12-07: qty 14, 7d supply, fill #0

## 2022-12-07 MED ORDER — AMLODIPINE BESYLATE 5 MG PO TABS
5.0000 mg | ORAL_TABLET | Freq: Once | ORAL | Status: AC
  Administered 2022-12-07: 5 mg via ORAL
  Filled 2022-12-07: qty 1

## 2022-12-07 MED ORDER — DIPHENHYDRAMINE HCL 50 MG/ML IJ SOLN
25.0000 mg | Freq: Once | INTRAMUSCULAR | Status: AC
  Administered 2022-12-07: 25 mg via INTRAVENOUS
  Filled 2022-12-07: qty 1

## 2022-12-07 MED ORDER — HYDROCHLOROTHIAZIDE 25 MG PO TABS
25.0000 mg | ORAL_TABLET | Freq: Every day | ORAL | 0 refills | Status: AC
  Filled 2022-12-07: qty 30, 30d supply, fill #0

## 2022-12-07 MED ORDER — FAMOTIDINE IN NACL 20-0.9 MG/50ML-% IV SOLN
20.0000 mg | Freq: Once | INTRAVENOUS | Status: AC
  Administered 2022-12-07: 20 mg via INTRAVENOUS
  Filled 2022-12-07: qty 50

## 2022-12-07 MED ORDER — BENZONATATE 100 MG PO CAPS
100.0000 mg | ORAL_CAPSULE | Freq: Three times a day (TID) | ORAL | 0 refills | Status: AC
  Filled 2022-12-07: qty 21, 7d supply, fill #0

## 2022-12-07 NOTE — ED Triage Notes (Signed)
Patient arrives ambulatory by POV c/o cough, emesis, and sore throat x 1 week. Patient reports still having scratchy throat and cough however has not been throwing up anymore. Attempted to go to work this am and felt dizzy. Reports sick contacts at work.

## 2022-12-07 NOTE — Discharge Instructions (Signed)
You were seen for your tongue swelling in the emergency department.   At home, please stop the amlodipine and start taking the hydrochlorothiazide.    Check your MyChart online for the results of any tests that had not resulted by the time you left the emergency department.   Follow-up with your primary doctor in 2-3 days regarding your visit.  If you do not have a primary care doctor you may follow-up with [facility] primary care which is listed in this packet.  Return immediately to the emergency department if you experience any of the following: worsening tongue swelling, shortness of breath, or any other concerning symptoms.    Thank you for visiting our Emergency Department. It was a pleasure taking care of you today.

## 2022-12-07 NOTE — ED Notes (Signed)
Patient transported to x-ray. ?

## 2022-12-07 NOTE — ED Provider Notes (Signed)
[place] [facility] AT [facility] Provider Note   CSN: [id number] Arrival date & time: 12/07/22  0730     History  Chief Complaint  Patient presents with   Cough   Dizziness    Clarence Wilson is a 52 y.o. male.   Cough Dizziness  Patient presents to the emergency room for evaluation of sore throat cough emesis.  Patient states his symptoms started about a week ago.  It was worse over the weekend.  He had fever.  He had an episode of vomiting.  Patient has been coughing and has had a scratchy throat.  Patient stayed out of work and went back to work today.  While he was there he got somewhat lightheaded.  He continues to cough.  There have been others that have been sick at work.  Patient did not feel he could continue to work so he came to the ED for evaluation.  Patient states he has been told that his blood pressure has been high in the past.  He does not see a primary doctor and does not take any medication for it    Home Medications Prior to Admission medications   Medication Sig Start Date End Date Taking? Authorizing Provider  amLODipine (NORVASC) 5 MG tablet Take 1 tablet (5 mg total) by mouth daily. 12/07/22  Yes Linwood Dibbles, MD  benzonatate (TESSALON) 100 MG capsule Take 1 capsule (100 mg total) by mouth every 8 (eight) hours. 12/07/22  Yes Linwood Dibbles, MD  Guaifenesin 1200 MG TB12 Take 1 tablet (1,200 mg total) by mouth 2 (two) times daily at 10 AM and 5 PM. 12/07/22  Yes Linwood Dibbles, MD  dicyclomine (BENTYL) 20 MG tablet Take 1 tablet (20 mg total) by mouth 2 (two) times daily. 10/24/21   Khatri, Hina, PA-C  ondansetron (ZOFRAN) 4 MG tablet Take 1 tablet (4 mg total) by mouth every 6 (six) hours. 03/08/22   Edwin Dada P, DO  ondansetron (ZOFRAN-ODT) 4 MG disintegrating tablet Take 1 tablet (4 mg total) by mouth every 8 (eight) hours as needed. 09/26/22   Karie Mainland, Amjad, PA-C  potassium chloride (KLOR-CON) 10 MEQ tablet Take 4 tablets (40 mEq total) by mouth  daily for 4 days. 10/24/21 10/28/21  Dietrich Pates, PA-C      Allergies    Patient has no known allergies.    Review of Systems   Review of Systems  Respiratory:  Positive for cough.   Neurological:  Positive for dizziness.    Physical Exam Updated Vital Signs BP (!) 166/94   Pulse 85   Temp 98 F (36.7 C) (Oral)   Resp 19   Ht 1.803 m (5\' 11" )   Wt 77.1 kg   SpO2 100%   BMI 23.71 kg/m  Physical Exam Vitals and nursing note reviewed.  Constitutional:      General: He is not in acute distress.    Appearance: He is well-developed.  HENT:     Head: Normocephalic and atraumatic.     Right Ear: External ear normal.     Left Ear: External ear normal.     Mouth/Throat:     Mouth: Mucous membranes are moist.     Pharynx: No oropharyngeal exudate or posterior oropharyngeal erythema.  Eyes:     General: No visual field deficit or scleral icterus.       Right eye: No discharge.        Left eye: No discharge.     Conjunctiva/sclera: Conjunctivae  normal.  Neck:     Trachea: No tracheal deviation.  Cardiovascular:     Rate and Rhythm: Normal rate and regular rhythm.  Pulmonary:     Effort: Pulmonary effort is normal. No respiratory distress.     Breath sounds: Normal breath sounds. No stridor. No wheezing or rales.  Abdominal:     General: Bowel sounds are normal. There is no distension.     Palpations: Abdomen is soft.     Tenderness: There is no abdominal tenderness. There is no guarding or rebound.  Musculoskeletal:        General: No tenderness.     Cervical back: Neck supple.  Skin:    General: Skin is warm and dry.     Findings: No rash.  Neurological:     Mental Status: He is alert and oriented to person, place, and time.     Cranial Nerves: No cranial nerve deficit, dysarthria or facial asymmetry.     Sensory: No sensory deficit.     Motor: No abnormal muscle tone, seizure activity or pronator drift.     Coordination: Coordination normal.     Comments:  able to  hold both legs off bed for 5 seconds, sensation intact in all extremities,  no left or right sided neglect, normal finger-nose exam bilaterally, no nystagmus noted   Psychiatric:        Mood and Affect: Mood normal.     ED Results / Procedures / Treatments   Labs (all labs ordered are listed, but only abnormal results are displayed) Labs Reviewed  BASIC METABOLIC PANEL - Abnormal; Notable for the following components:      Result Value   Potassium 3.4 (*)    Glucose, Bld 145 (*)    All other components within normal limits  GROUP A STREP BY PCR  CBC    EKG EKG Interpretation  Date/Time:  Thursday Dec 07 2022 07:43:40 EDT Ventricular Rate:  102 PR Interval:  140 QRS Duration: 93 QT Interval:  358 QTC Calculation: 467 R Axis:   80 Text Interpretation: Sinus tachycardia Biatrial enlargement Borderline T wave abnormalities Confirmed by Linwood Dibbles [phone]) on 12/07/2022 7:49:49 AM  Radiology DG Chest 2 View  Result Date: 12/07/2022 CLINICAL DATA:  Cough EXAM: CHEST - 2 VIEW COMPARISON:  Chest x-ray September 26, 2022 FINDINGS: The cardiomediastinal silhouette is unchanged in contour. No focal pulmonary opacity. No pleural effusion or pneumothorax. The visualized upper abdomen is unremarkable. No acute osseous abnormality. IMPRESSION: No active cardiopulmonary disease. Electronically Signed   By: Jacob Moores M.D.   On: 12/07/2022 08:04    Procedures Procedures    Medications Ordered in ED Medications  amLODipine (NORVASC) tablet 5 mg (5 mg Oral Given 12/07/22 0758)    ED Course/ Medical Decision Making/ A&P Clinical Course as of 12/07/22 0847  Thu Dec 07, 2022  0833 CBC and metabolic panel unremarkable.  Strep test negative. [JK]  [id number] Chest x-ray without acute findings [JK]    Clinical Course User Index [JK] Linwood Dibbles, MD                             Medical Decision Making Problems Addressed: Uncontrolled hypertension: chronic illness or injury with exacerbation,  progression, or side effects of treatment Upper respiratory tract infection, unspecified type: acute illness or injury that poses a threat to life or bodily functions  Amount and/or Complexity of Data Reviewed Labs: ordered. Decision-making details documented  in ED Course. Radiology: ordered and independent interpretation performed.  Risk OTC drugs. Prescription drug management.   Patient presented to ED for evaluation of cough congestion and some lightheadedness.  ED workup reassuring.  No runs of pneumonia.  No signs of CHF.  Patient does not have any PE risk factors.  PERC negative.  SPECT symptoms are related to a URI.  Patient also noted to have elevated blood pressure.  Previous records were reviewed and he has consistently had elevated blood pressures documented.  Patient does not see a doctor for his blood pressure.  He is not having any signs of focal neurologic deficits.  I doubt stroke or TIA related to his hypertension.  However do think he needs to start taking medications for his blood pressure.  Will start him on Norvasc.  Discussed the importance of outpatient follow-up with a primary care doctor.        Final Clinical Impression(s) / ED Diagnoses Final diagnoses:  Uncontrolled hypertension  Upper respiratory tract infection, unspecified type    Rx / DC Orders ED Discharge Orders          Ordered    benzonatate (TESSALON) 100 MG capsule  Every 8 hours        12/07/22 0839    Guaifenesin 1200 MG TB12  2 times daily        12/07/22 0839    amLODipine (NORVASC) 5 MG tablet  Daily        12/07/22 0839              Linwood Dibbles, MD 12/07/22 [phone]

## 2022-12-07 NOTE — ED Triage Notes (Addendum)
Pt reports tongue swelling x1 hour. No known allergies. Pt given new BP medication this morning.

## 2022-12-07 NOTE — Discharge Instructions (Addendum)
Take the medications to help with your cough and congestion.  Your symptoms should resolve over the next week.  I have also given you prescription for a high blood pressure medication.  Schedule an appointment with a primary care doctor to continue management and treatment of your elevated blood pressure.

## 2022-12-07 NOTE — ED Provider Notes (Signed)
[place] [facility] Provider Note   CSN: [id number] Arrival date & time: 12/07/22  1940     History  Chief Complaint  Patient presents with   Allergic Reaction    Clarence Wilson is a 52 y.o. male.  53 year old male previously healthy presents emergency department with tongue swelling.  Patient was in the emergency department earlier today for uncontrolled hypertension and cough and sore throat.  Was given amlodipine and went home.  Says he was doing well until several hours ago refill like his tongue may be swelling.  No personal or family history of angioedema.  Not on any other medications.  No personal history of allergies to food or medication.  No rash.  No difficulty speaking or swallowing.  No shortness of breath.       Home Medications Prior to Admission medications   Medication Sig Start Date End Date Taking? Authorizing Provider  hydrochlorothiazide (HYDRODIURIL) 25 MG tablet Take 1 tablet (25 mg total) by mouth daily. 12/07/22  Yes Rondel Baton, MD  benzonatate (TESSALON) 100 MG capsule Take 1 capsule (100 mg total) by mouth every 8 (eight) hours. 12/07/22   Linwood Dibbles, MD  dicyclomine (BENTYL) 20 MG tablet Take 1 tablet (20 mg total) by mouth 2 (two) times daily. 10/24/21   Khatri, Hina, PA-C  Guaifenesin 1200 MG TB12 Take 1 tablet (1,200 mg total) by mouth 2 (two) times daily at 10 AM and 5 PM. 12/07/22   Linwood Dibbles, MD  ondansetron (ZOFRAN) 4 MG tablet Take 1 tablet (4 mg total) by mouth every 6 (six) hours. 03/08/22   Edwin Dada P, DO  ondansetron (ZOFRAN-ODT) 4 MG disintegrating tablet Take 1 tablet (4 mg total) by mouth every 8 (eight) hours as needed. 09/26/22   Karie Mainland, Amjad, PA-C  potassium chloride (KLOR-CON) 10 MEQ tablet Take 4 tablets (40 mEq total) by mouth daily for 4 days. 10/24/21 10/28/21  Dietrich Pates, PA-C      Allergies    Patient has no known allergies.    Review of Systems   Review of Systems  Physical  Exam Updated Vital Signs BP (!) 178/104   Pulse 81   Temp 98.3 F (36.8 C) (Oral)   Resp 18   Ht 5\' 11"  (1.803 m)   Wt 77.1 kg   SpO2 99%   BMI 23.71 kg/m  Physical Exam Vitals and nursing note reviewed.  Constitutional:      General: He is not in acute distress.    Appearance: He is well-developed.     Comments: Tolerating secretions.  No voice changes.  HENT:     Head: Normocephalic and atraumatic.     Right Ear: External ear normal.     Left Ear: External ear normal.     Nose: Nose normal.     Mouth/Throat:     Mouth: Mucous membranes are moist.     Pharynx: Oropharynx is clear.     Comments: Mallampati 4.  Tongue does not appear significantly swollen.  No tooth tenderness to palpation or brawny edema under the tongue. Eyes:     Extraocular Movements: Extraocular movements intact.     Conjunctiva/sclera: Conjunctivae normal.     Pupils: Pupils are equal, round, and reactive to light.  Cardiovascular:     Rate and Rhythm: Normal rate and regular rhythm.     Heart sounds: Normal heart sounds.  Pulmonary:     Effort: Pulmonary effort is normal. No respiratory distress.  Breath sounds: Normal breath sounds. No stridor.  Abdominal:     Palpations: Abdomen is soft.  Musculoskeletal:     Cervical back: Normal range of motion and neck supple.     Right lower leg: No edema.     Left lower leg: No edema.  Skin:    General: Skin is warm and dry.  Neurological:     Mental Status: He is alert. Mental status is at baseline.  Psychiatric:        Mood and Affect: Mood normal.        Behavior: Behavior normal.     ED Results / Procedures / Treatments   Labs (all labs ordered are listed, but only abnormal results are displayed) Labs Reviewed - No data to display  EKG None  Radiology DG Chest 2 View  Result Date: 12/07/2022 CLINICAL DATA:  Cough EXAM: CHEST - 2 VIEW COMPARISON:  Chest x-ray September 26, 2022 FINDINGS: The cardiomediastinal silhouette is unchanged in  contour. No focal pulmonary opacity. No pleural effusion or pneumothorax. The visualized upper abdomen is unremarkable. No acute osseous abnormality. IMPRESSION: No active cardiopulmonary disease. Electronically Signed   By: Jacob Moores M.D.   On: 12/07/2022 08:04    Procedures Procedures    Medications Ordered in ED Medications  methylPREDNISolone sodium succinate (SOLU-MEDROL) 125 mg/2 mL injection 125 mg (125 mg Intravenous Given 12/07/22 2012)  famotidine (PEPCID) IVPB 20 mg premix (0 mg Intravenous Stopped 12/07/22 2047)  diphenhydrAMINE (BENADRYL) injection 25 mg (25 mg Intravenous Given 12/07/22 2012)    ED Course/ Medical Decision Making/ A&P                             Medical Decision Making Risk Prescription drug management.   Clarence Wilson is a 52 y.o. male with comorbidities that complicate the patient evaluation including hypertension presents to the emergency department tongue swelling  Initial Ddx:  Medications associated angioedema, allergic reaction, hereditary angioedema, infection  MDM:  Patient's airway appears patent at this time.  I am not able to appreciate any significant tongue swelling currently but will observe the patient's to see if his tongue swelling worsens at all.  Would be rare for Norvasc to cause angioedema as well.  No family history of angioedema.  No signs of Ludwig's angina or other infection that would be causing this.  Plan:  Solu-Medrol Benadryl Pepcid Observation  ED Summary/Re-evaluation:  Patient monitored in the emergency department did not have any worsening of his symptoms.  Did not develop any other symptoms that would be concerning for an allergic reaction.  Since I do not appreciate any significant tongue swelling at this time do not think the patient needs admission for observation for angioedema.  Did counsel the patient on symptoms to watch out for and instructed him to return immediately to the emergency department or  call 911 if he develops any of those symptoms.  Changes medication from the amlodipine that was prescribed to hydrochlorothiazide and will have him follow-up with his primary doctor about this.   This patient presents to the ED for concern of complaints listed in HPI, this involves an extensive number of treatment options, and is a complaint that carries with it a high risk of complications and morbidity. Disposition including potential need for admission considered.   Dispo: DC Home. Return precautions discussed including, but not limited to, those listed in the AVS. Allowed pt time to ask questions which were  answered fully prior to dc.  Records reviewed ED Visit Notes I have reviewed the patients home medications and made adjustments as needed        Final Clinical Impression(s) / ED Diagnoses Final diagnoses:  Tongue swelling  Adverse effect of drug, initial encounter    Rx / DC Orders ED Discharge Orders          Ordered    hydrochlorothiazide (HYDRODIURIL) 25 MG tablet  Daily        12/07/22 2253              Rondel Baton, MD 12/07/22 2349

## 2022-12-07 NOTE — ED Notes (Signed)
Pt sts that he feels like his tongue is less swollen now and denies any other symptoms. Given warm blanket and lights turned down for comfort.

## 2022-12-08 ENCOUNTER — Other Ambulatory Visit: Payer: Self-pay

## 2022-12-08 ENCOUNTER — Other Ambulatory Visit (HOSPITAL_BASED_OUTPATIENT_CLINIC_OR_DEPARTMENT_OTHER): Payer: Self-pay

## 2023-01-04 IMAGING — CT CT CHEST W/O CM
2 of 3 series · 15 of 36 positions shown, 18 images · non-contrast
Comparison: 08/16/2020

CLINICAL DATA: Cough, rib pain.  Recent rib fracture

EXAM:
CT CHEST WITHOUT CONTRAST
TECHNIQUE: Multidetector CT imaging of the chest was performed following the
standard protocol without IV contrast.

[Series 2: thorax · axial · 0.74mm/px · z∈[-360,-38]mm · 12 of 189 slices shown, 15 images]
[im 14/189  mediastinal]
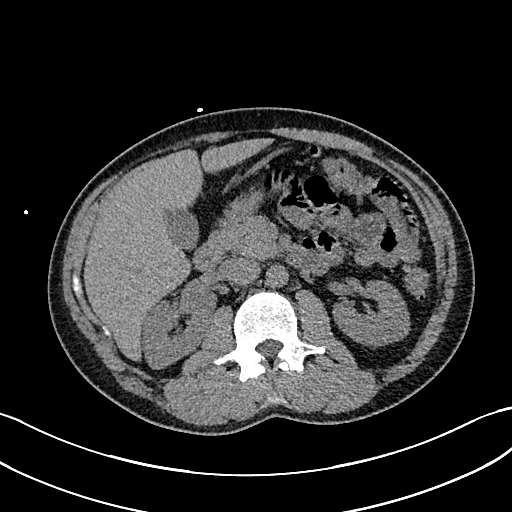
[im 14/189  lung]
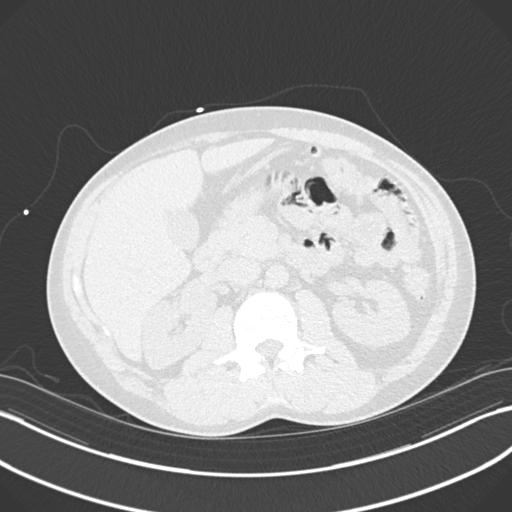
[im 28/189  lung]
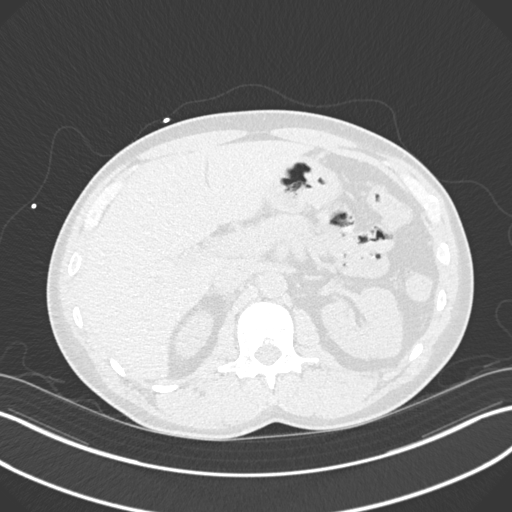
[im 42/189  lung]
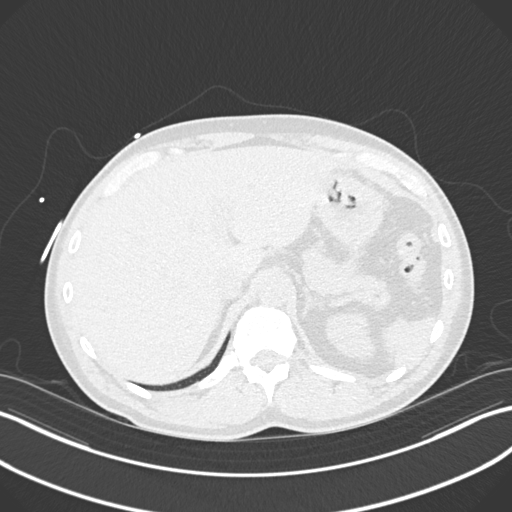
[im 56/189  lung]
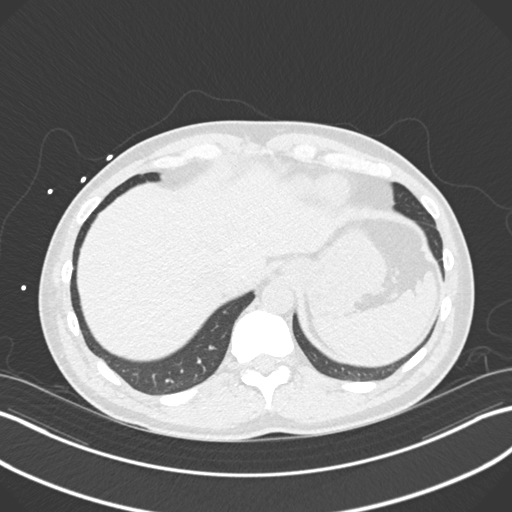
[im 70/189  mediastinal]
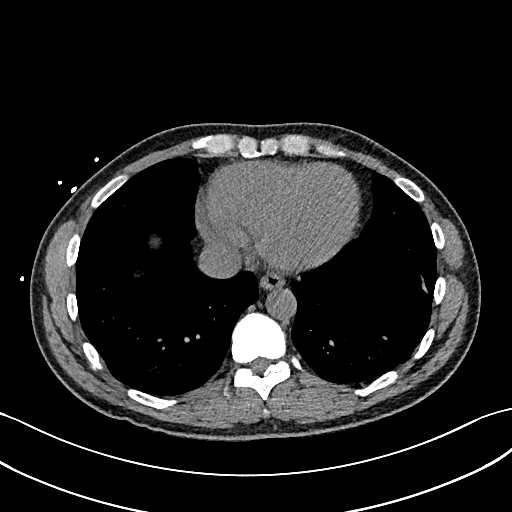
[im 70/189  lung]
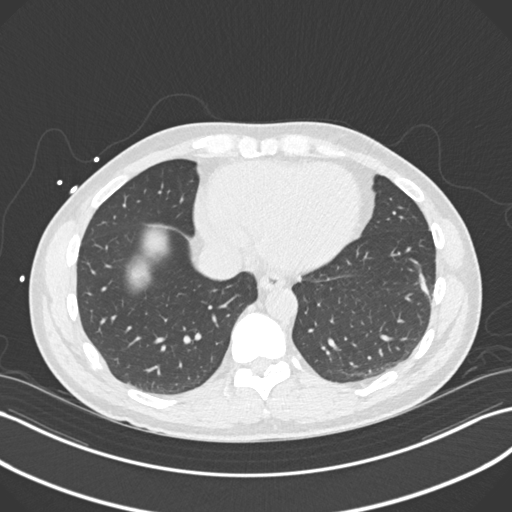
[im 84/189  lung]
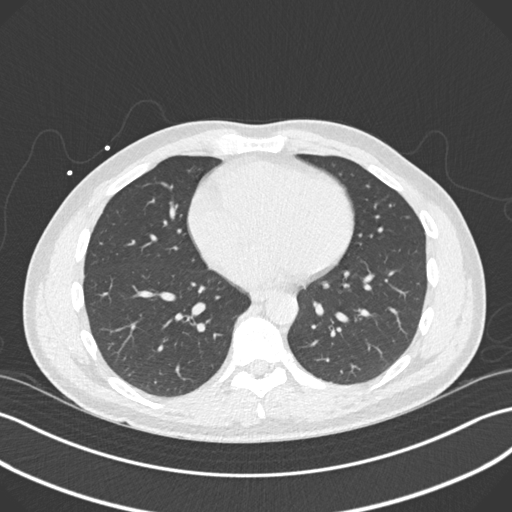
[im 105/189  lung]
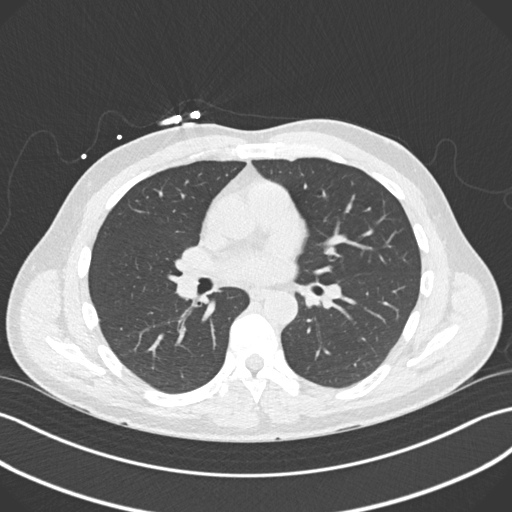
[im 119/189  lung]
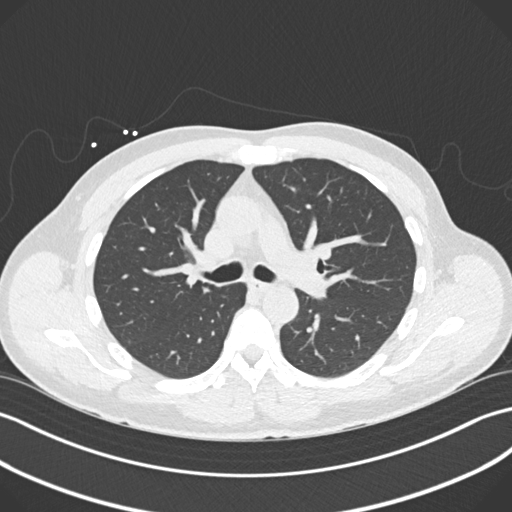
[im 133/189  mediastinal]
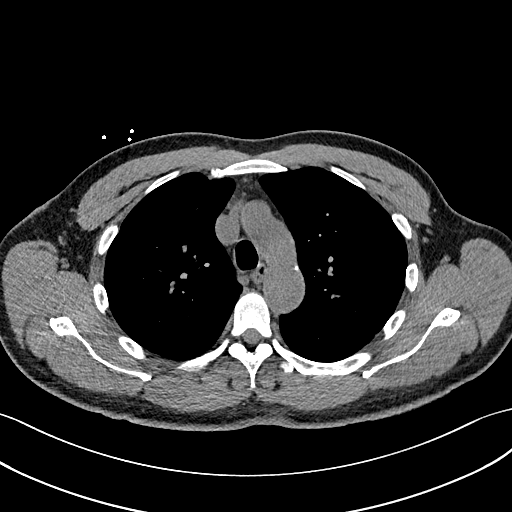
[im 133/189  lung]
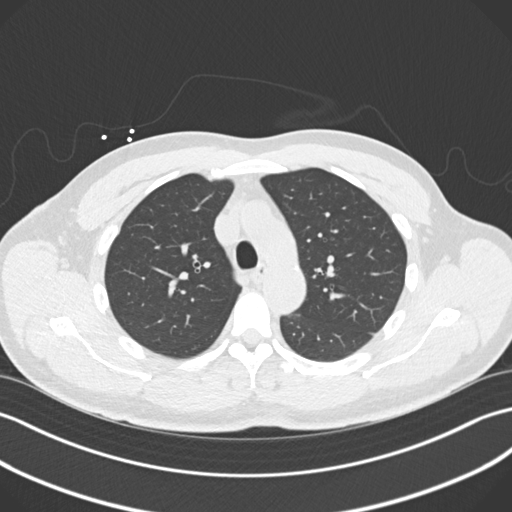
[im 147/189  lung]
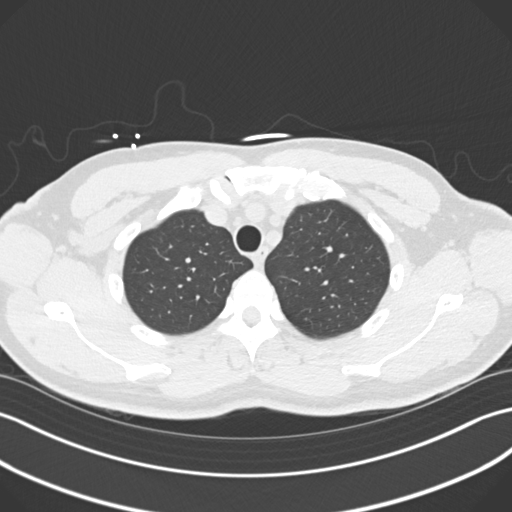
[im 161/189  lung]
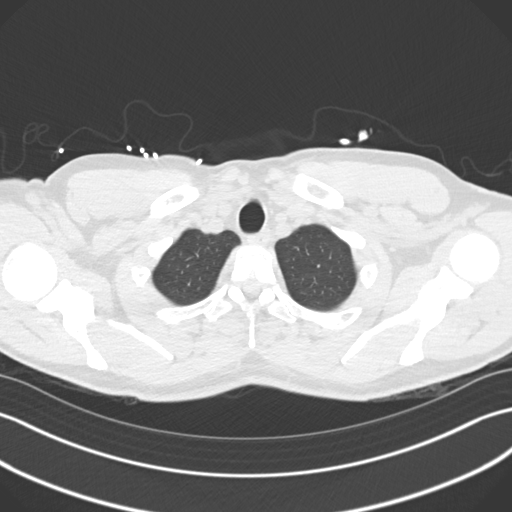
[im 175/189  lung]
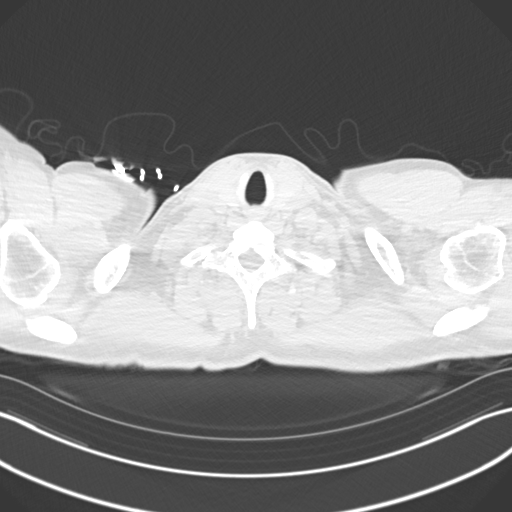

[Series 5: coronal · coronal · 0.69mm/px · 3 of 126 slices shown]
[im 26/126  lung]
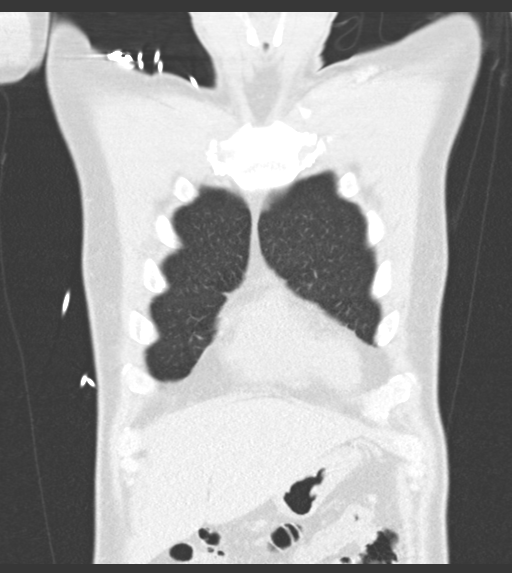
[im 51/126  lung]
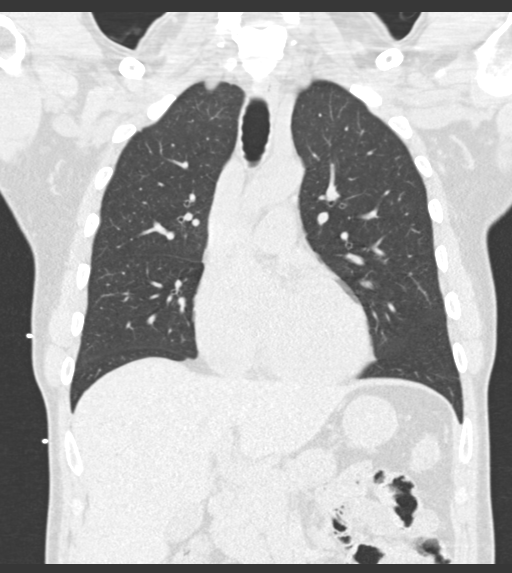
[im 76/126  lung]
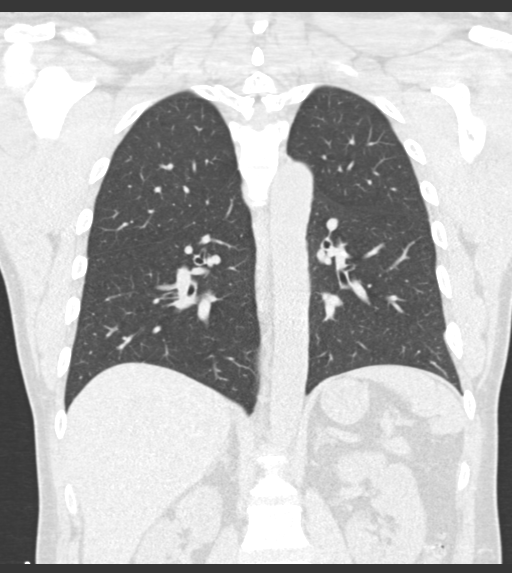

[15 of 36 positions shown; findings below may reference images not displayed]

FINDINGS: Cardiovascular: Heart size is normal. No pericardial effusion.
Thoracic aorta is normal in course and caliber. Minimal
atherosclerotic calcification of the thoracic aorta. Main pulmonary
trunk is normal in caliber.

Mediastinum/Nodes: No enlarged mediastinal or axillary lymph nodes.
Thyroid gland, trachea, and esophagus demonstrate no significant
findings.

Lungs/Pleura: No pulmonary contusion or laceration. Focal area of
pleuroparenchymal scarring at the peripheral aspect of the left
lower lobe. No focal airspace consolidation. No pleural effusion or
pneumothorax.

Upper Abdomen: No acute findings.  Scattered colonic diverticulosis.

Musculoskeletal: Subacute minimally displaced fractures of the
posterolateral aspects of the right ninth and tenth ribs (series 5,
images 97 and 100) with early callus formation. Remaining visualized
osseous structures are intact. Thoracic vertebral body heights and
alignment are maintained.
IMPRESSION: 1. Healing subacute minimally displaced fractures of the
posterolateral aspects of the right ninth and tenth ribs.
2. Lungs are clear.  No pneumothorax.
3. Colonic diverticulosis.
4. Aortic atherosclerosis ([id number]).

## 2024-02-25 IMAGING — CT CT ABD-PELV W/ CM
2 of 5 series · 15 of 46 positions shown, 17 images · IV contrast (Omnipaque)
Comparison: 01/30/2016

CLINICAL DATA: Pain left lower quadrant of abdomen

EXAM:
CT ABDOMEN AND PELVIS WITH CONTRAST
TECHNIQUE: Multidetector CT imaging of the abdomen and pelvis was performed
using the standard protocol following bolus administration of
intravenous contrast.

[Series 2: axial st · axial · 0.84mm/px · z∈[-523,-68]mm · 12 of 103 slices shown, 14 images]
[im 6/103  soft-tissue]
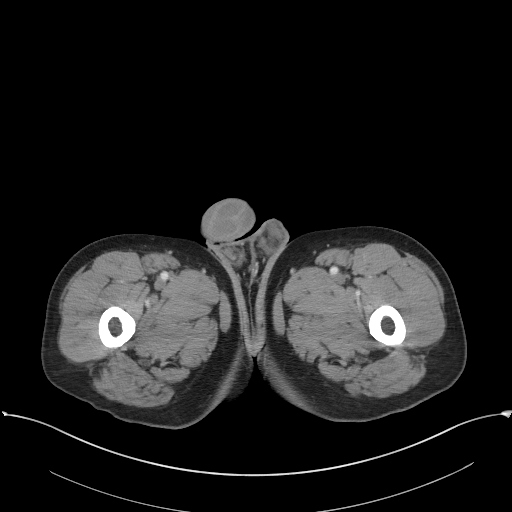
[im 6/103  bone]
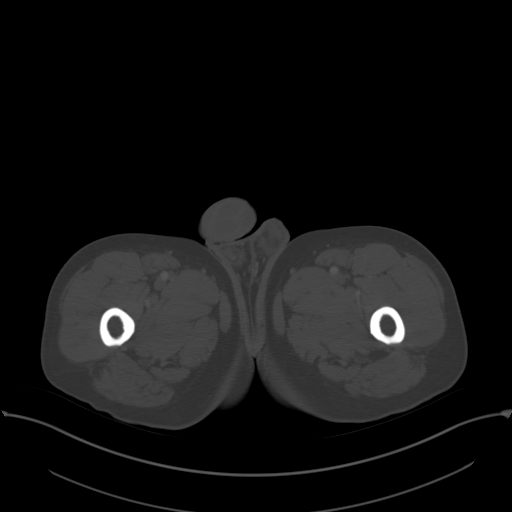
[im 17/103  soft-tissue]
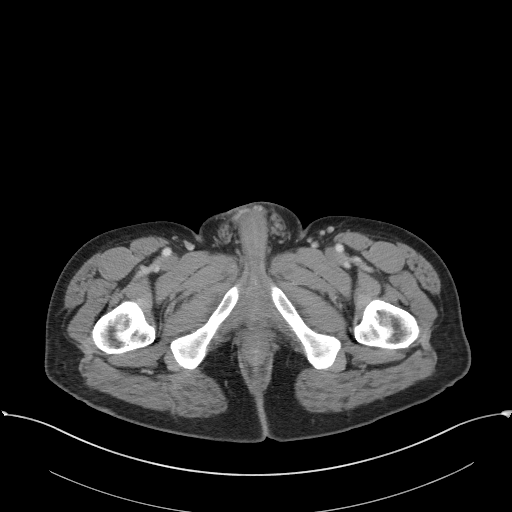
[im 22/103  soft-tissue]
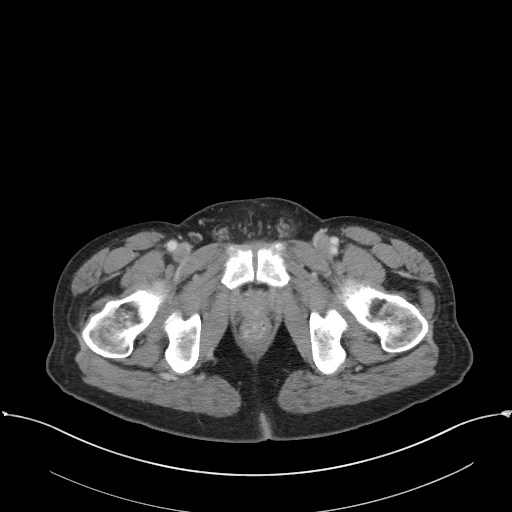
[im 33/103  soft-tissue]
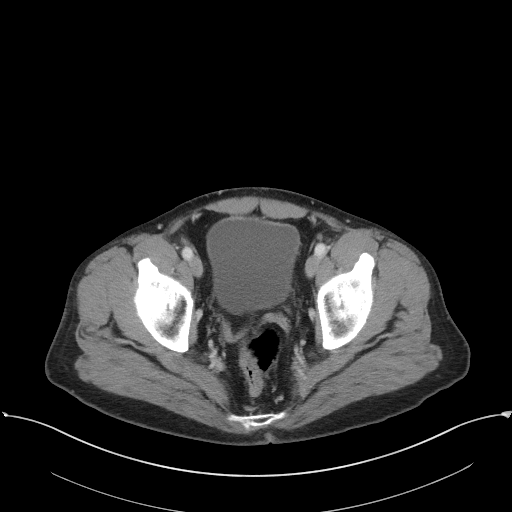
[im 38/103  soft-tissue]
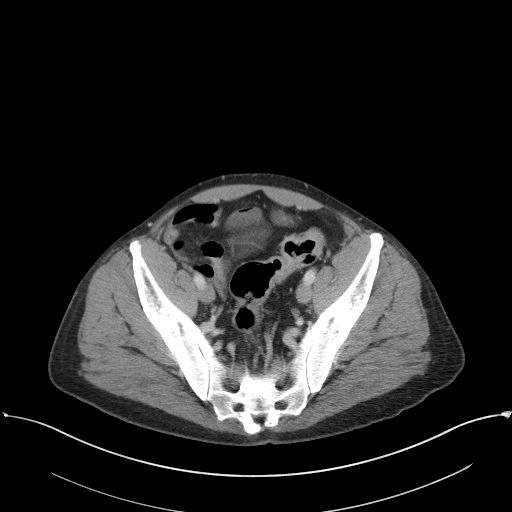
[im 49/103  soft-tissue]
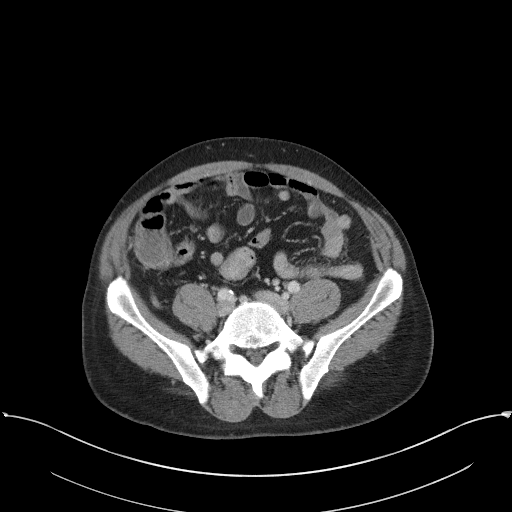
[im 54/103  soft-tissue]
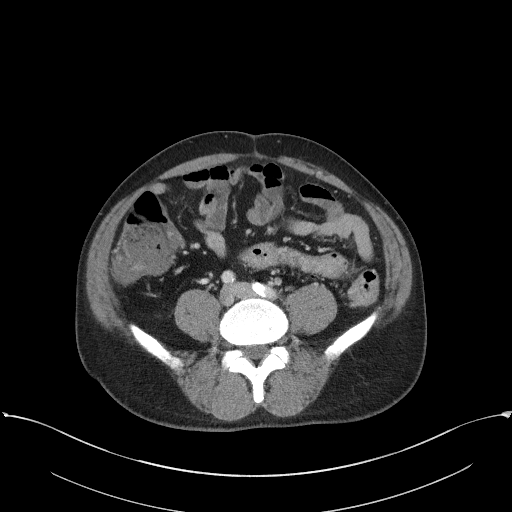
[im 65/103  soft-tissue]
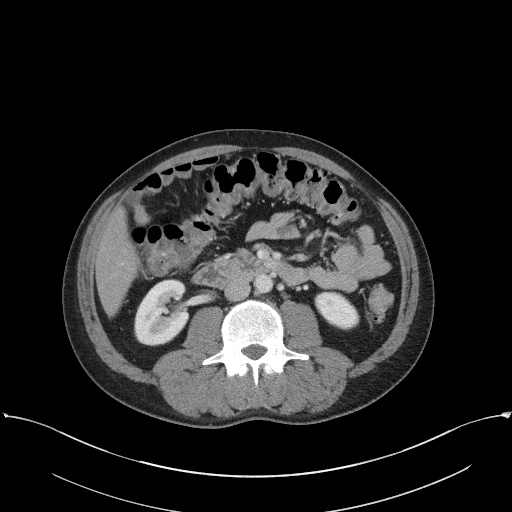
[im 70/103  soft-tissue]
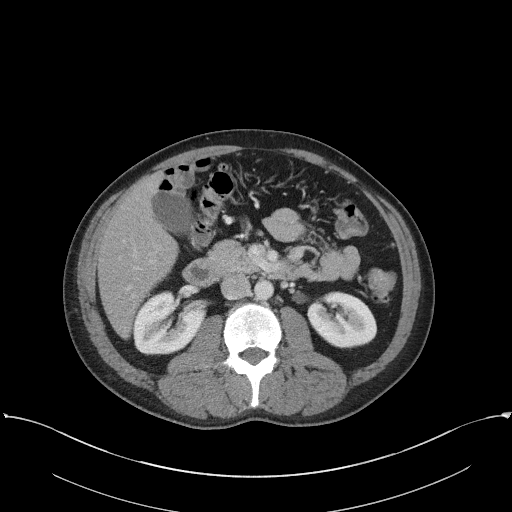
[im 70/103  bone]
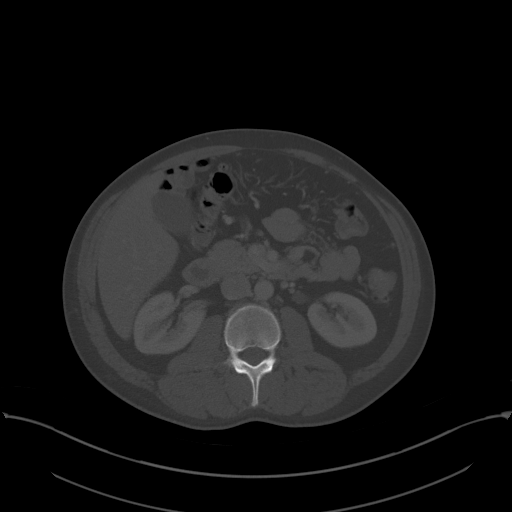
[im 81/103  soft-tissue]
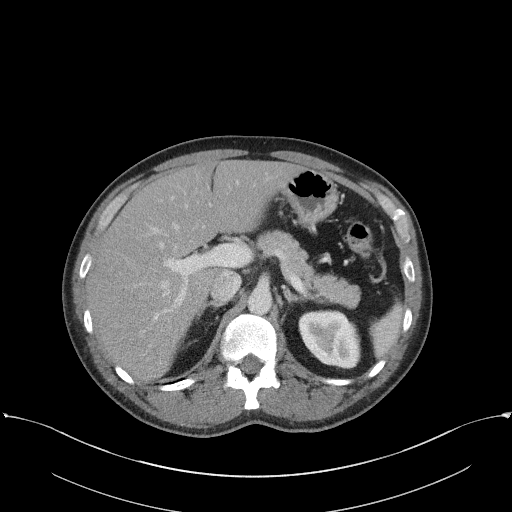
[im 86/103  soft-tissue]
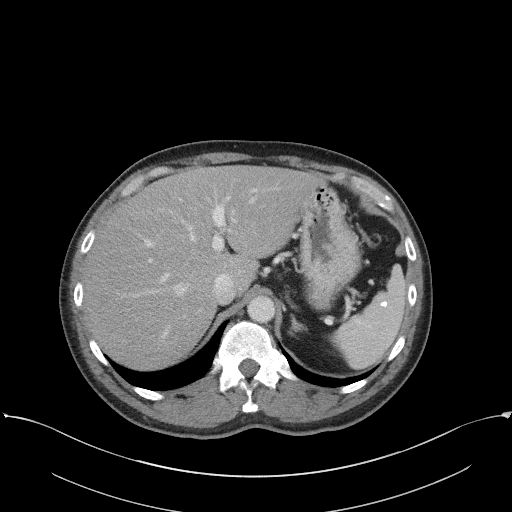
[im 97/103  soft-tissue]
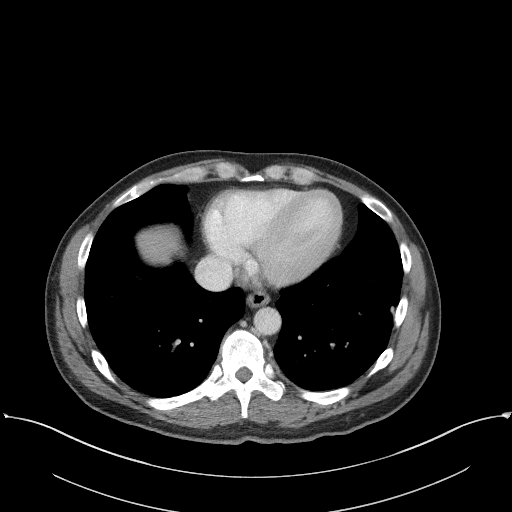

[Series 5: coronal st · coronal · 0.70mm/px · 3 of 88 slices shown]
[im 30/88  soft-tissue]
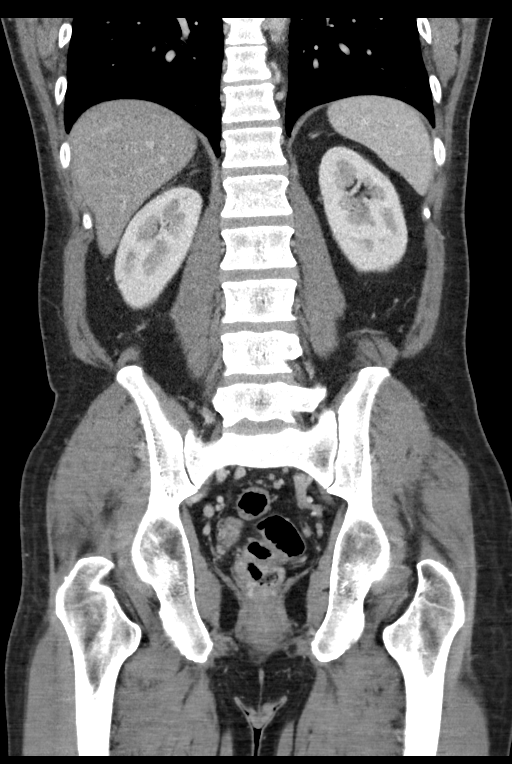
[im 39/88  soft-tissue]
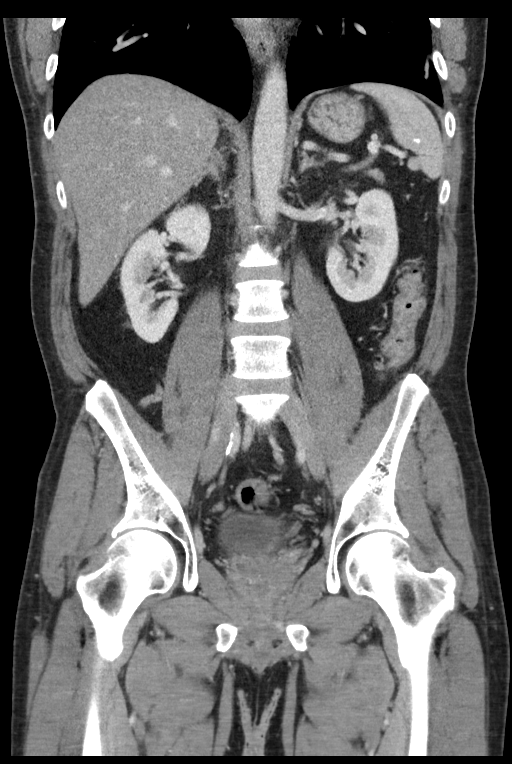
[im 49/88  soft-tissue]
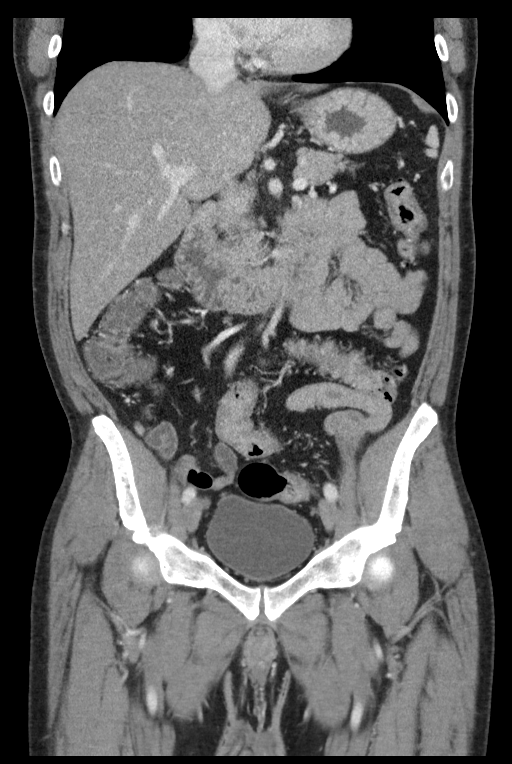

[15 of 46 positions shown; findings below may reference images not displayed]

RADIATION DOSE REDUCTION: This exam was performed according to the
departmental dose-optimization program which includes automated
exposure control, adjustment of the mA and/or kV according to
patient size and/or use of iterative reconstruction technique.

CONTRAST:  100mL OMNIPAQUE IOHEXOL 300 MG/ML  SOLN
FINDINGS: Lower chest: Linear densities in the lower lung fields may suggest
scarring or subsegmental atelectasis.

Hepatobiliary: Liver measures 19.8 cm in length. There is fatty
infiltration. There is no dilation of bile ducts. Gallbladder is
unremarkable.

Pancreas: No focal abnormality is seen.

Spleen: Coarse calcification is seen.  Spleen is not enlarged.

Adrenals/Urinary Tract: Adrenals are unremarkable. There is no
hydronephrosis. There are no renal or ureteral stones. Urinary
bladder is unremarkable.

Stomach/Bowel: Stomach is unremarkable. Small bowel loops are not
dilated. Appendix is not dilated. Scattered diverticula are seen in
colon without signs of focal acute diverticulitis. There is mild
diffuse wall thickening in the sigmoid colon. In image 67 of series
2, there is wall thickening in the sigmoid colon measuring
proximally 1.9 cm in length. There is no pericolic stranding or
fluid collection.

Vascular/Lymphatic: There are scattered arterial calcifications.

Reproductive: Unremarkable.

Other: There is no ascites or pneumoperitoneum.

Musculoskeletal: Unremarkable.
IMPRESSION: There is no evidence of intestinal obstruction or pneumoperitoneum.
There is no hydronephrosis. Appendix is not dilated.

Enlarged fatty liver. Diverticulosis of colon without signs of focal
acute diverticulitis.

There is wall thickening in the sigmoid colon which may be due to
incomplete distention or chronic diverticulitis.

There is focal wall thickening in the sigmoid colon measuring
approximately 1.9 cm in length in the left side of pelvis. This may
be due to incomplete distention or suggest focal inflammatory or
neoplastic process. Endoscopy when the patient's clinical condition
permits should be considered.

Other findings as described in the body of the report.
